# Patient Record
Sex: Female | Born: 1945 | Race: White | Hispanic: No | Marital: Single | Smoking: Never smoker
Health system: Southern US, Community
[De-identification: ages and names within clinical notes are randomized; demographics above are authoritative.]

## PROBLEM LIST (undated history)

## (undated) DIAGNOSIS — E7800 Pure hypercholesterolemia, unspecified: Secondary | ICD-10-CM

## (undated) DIAGNOSIS — E039 Hypothyroidism, unspecified: Secondary | ICD-10-CM

## (undated) DIAGNOSIS — C4490 Unspecified malignant neoplasm of skin, unspecified: Secondary | ICD-10-CM

## (undated) DIAGNOSIS — E785 Hyperlipidemia, unspecified: Secondary | ICD-10-CM

## (undated) DIAGNOSIS — I10 Essential (primary) hypertension: Secondary | ICD-10-CM

## (undated) DIAGNOSIS — N183 Chronic kidney disease, stage 3 (moderate): Secondary | ICD-10-CM

## (undated) HISTORY — PX: DILATION AND CURETTAGE OF UTERUS: SHX78

## (undated) HISTORY — DX: Hyperlipidemia, unspecified: E78.5

## (undated) HISTORY — DX: Hypothyroidism, unspecified: E03.9

## (undated) HISTORY — DX: Chronic kidney disease, stage 3 (moderate): N18.3

## (undated) HISTORY — PX: BREAST CYST EXCISION: SHX579

## (undated) HISTORY — DX: Pure hypercholesterolemia, unspecified: E78.00

## (undated) HISTORY — PX: TONSILLECTOMY: SUR1361

## (undated) HISTORY — DX: Essential (primary) hypertension: I10

## (undated) HISTORY — DX: Unspecified malignant neoplasm of skin, unspecified: C44.90

---

## 2013-07-13 DIAGNOSIS — I10 Essential (primary) hypertension: Secondary | ICD-10-CM

## 2013-07-13 DIAGNOSIS — E785 Hyperlipidemia, unspecified: Secondary | ICD-10-CM

## 2013-07-13 DIAGNOSIS — E7800 Pure hypercholesterolemia, unspecified: Secondary | ICD-10-CM

## 2013-07-13 HISTORY — DX: Essential (primary) hypertension: I10

## 2013-07-13 HISTORY — DX: Hyperlipidemia, unspecified: E78.5

## 2013-07-13 HISTORY — DX: Pure hypercholesterolemia, unspecified: E78.00

## 2013-12-08 DIAGNOSIS — E039 Hypothyroidism, unspecified: Secondary | ICD-10-CM

## 2013-12-08 HISTORY — DX: Hypothyroidism, unspecified: E03.9

## 2014-12-06 ENCOUNTER — Other Ambulatory Visit: Payer: Self-pay | Admitting: Internal Medicine

## 2014-12-06 DIAGNOSIS — N183 Chronic kidney disease, stage 3 unspecified: Secondary | ICD-10-CM

## 2014-12-06 DIAGNOSIS — Z0000 Encounter for general adult medical examination without abnormal findings: Secondary | ICD-10-CM | POA: Insufficient documentation

## 2014-12-06 HISTORY — DX: Chronic kidney disease, stage 3 unspecified: N18.30

## 2014-12-13 ENCOUNTER — Ambulatory Visit
Admission: RE | Admit: 2014-12-13 | Discharge: 2014-12-13 | Disposition: A | Discharge status: Home / Self Care | Payer: Medicare Other | Source: Ambulatory Visit | Attending: Internal Medicine | Admitting: Internal Medicine

## 2014-12-13 DIAGNOSIS — N183 Chronic kidney disease, stage 3 (moderate): Secondary | ICD-10-CM | POA: Diagnosis present

## 2014-12-27 ENCOUNTER — Other Ambulatory Visit: Payer: Self-pay | Admitting: Internal Medicine

## 2014-12-27 DIAGNOSIS — N281 Cyst of kidney, acquired: Secondary | ICD-10-CM

## 2015-06-13 ENCOUNTER — Ambulatory Visit: Payer: Medicare Other

## 2015-06-13 ENCOUNTER — Other Ambulatory Visit: Payer: Medicare Other

## 2015-06-22 ENCOUNTER — Ambulatory Visit
Admission: RE | Admit: 2015-06-22 | Discharge: 2015-06-22 | Disposition: A | Discharge status: Home / Self Care | Payer: Medicare Other | Source: Ambulatory Visit | Attending: Internal Medicine | Admitting: Internal Medicine

## 2015-06-22 DIAGNOSIS — N1339 Other hydronephrosis: Secondary | ICD-10-CM | POA: Diagnosis not present

## 2015-06-22 DIAGNOSIS — N281 Cyst of kidney, acquired: Secondary | ICD-10-CM | POA: Insufficient documentation

## 2015-06-22 DIAGNOSIS — R39198 Other difficulties with micturition: Secondary | ICD-10-CM | POA: Insufficient documentation

## 2015-07-24 ENCOUNTER — Encounter: Payer: Self-pay | Admitting: Urology

## 2015-07-24 ENCOUNTER — Ambulatory Visit (INDEPENDENT_AMBULATORY_CARE_PROVIDER_SITE_OTHER): Payer: Medicare Other | Admitting: Urology

## 2015-07-24 VITALS — BP 178/85 | HR 83 | Ht 63.0 in | Wt 158.0 lb

## 2015-07-24 DIAGNOSIS — N281 Cyst of kidney, acquired: Secondary | ICD-10-CM

## 2015-07-24 DIAGNOSIS — Q6100 Congenital renal cyst, unspecified: Secondary | ICD-10-CM

## 2015-07-24 DIAGNOSIS — N1330 Unspecified hydronephrosis: Secondary | ICD-10-CM

## 2015-07-24 LAB — URINALYSIS, COMPLETE
Bilirubin, UA: NEGATIVE
GLUCOSE, UA: NEGATIVE
KETONES UA: NEGATIVE
NITRITE UA: NEGATIVE
Protein, UA: NEGATIVE
RBC, UA: NEGATIVE
SPEC GRAV UA: 1.015 (ref 1.005–1.030)
UUROB: 0.2 mg/dL (ref 0.2–1.0)
pH, UA: 7 (ref 5.0–7.5)

## 2015-07-24 LAB — MICROSCOPIC EXAMINATION
BACTERIA UA: NONE SEEN
RBC, UA: NONE SEEN /hpf (ref 0–?)

## 2015-07-24 NOTE — Progress Notes (Signed)
07/24/2015 7:51 PM   Bing Quarry 30-Dec-1945 [id number]  Referring provider: Lavonia Dana, MD [address] [place], [place] [address]  Chief Complaint  Patient presents with  . Hydronephrosis    New Patient    HPI: 70 year old female who is referred for further evaluation of incidental mild bilateral hydronephrosis on most recent ultrasound. She has a history of stage III CKD and is followed by Dr. Juleen China.  She was initially evaluated by nephrology starting in 11/2014 for a rise in her creatinine to 1.3 up from her previous baseline of 1.0.  At the time, she was taking copious NSAIDs and has since stopped this medication. Her most recent creatinine was 1.1 on 04/14/2015. As part of her workup, she initially underwent a renal ultrasound on 12/13/2015 which showed an incidental mildly complex cyst in the left kidney measuring 2.5 cm with a septation.  As part of her follow-up for this), she underwent repeat renal ultrasound on 06/22/2015. The cyst remained stable but this time, she was noted to have very mild bilateral hydronephrosis, R>L which persisted after she emptied her bladder. Postvoid residual 29 cc.  She denies a history of voiding difficulties. She has no urinary complaints today. No history of urinary tract infections dysuria or sensation of incomplete bladder emptying. She denies a history of gross hematuria. No history of flank pain or kidney stones.    PMH: Past Medical History  Diagnosis Date  . Benign hypertension 07/13/2013    Last Assessment & Plan:  Is compliant with hypertensive medications without clear side effects or lack of control.  Nausea and itching are not symptomatic and nsaids are being avoided.     . Acquired hypothyroidism 12/08/2013    Last Assessment & Plan:  Tsh is followed and energy is doing well.    . Chronic kidney disease (CKD), stage III (moderate) 12/06/2014  . Pure hypercholesterolemia 07/13/2013    Last Assessment &  Plan:  Appropriate diet is being attempted and myalgia's are not noted.     Marland Kitchen HLD (hyperlipidemia) 07/13/2013  . Skin cancer     Surgical History: Past Surgical History  Procedure Laterality Date  . Dilation and curettage of uterus    . Tonsillectomy    . Breast cyst excision      Home Medications:    Medication List       This list is accurate as of: 07/24/15  7:51 PM.  Always use your most recent med list.               aspirin 81 MG chewable tablet  Chew by mouth.     CALCIUM 600 + MINERALS PO  Take by mouth.     Ginkgo Biloba 40 MG Tabs  Take by mouth.     L-Lysine 500 MG Tabs  Take by mouth.     levothyroxine 25 MCG tablet  Commonly known as:  SYNTHROID, LEVOTHROID  Take by mouth.     losartan-hydrochlorothiazide 50-12.5 MG tablet  Commonly known as:  HYZAAR  Take by mouth.     magnesium 30 MG tablet  Take 30 mg by mouth 2 (two) times daily.     MULTI-VITAMINS Tabs     niacin 500 MG tablet  Take by mouth.     pravastatin 10 MG tablet  Commonly known as:  PRAVACHOL  Take by mouth.     Ubiquinol 100 MG Caps  Take by mouth.     VITAMIN D-1000 MAX ST 1000  units tablet  Generic drug:  Cholecalciferol  Take by mouth.     vitamin E 400 UNIT capsule  Take by mouth.        Allergies: No Known Allergies  Family History: Family History  Problem Relation Age of Onset  . Bladder Cancer Neg Hx   . Prostate cancer Father   . Kidney cancer Neg Hx     Social History:  reports that she has never smoked. She does not have any smokeless tobacco history on file. She reports that she drinks alcohol. She reports that she does not use illicit drugs.  ROS: UROLOGY Frequent Urination?: No Hard to postpone urination?: No Burning/pain with urination?: No Get up at night to urinate?: No Leakage of urine?: No Urine stream starts and stops?: No Trouble starting stream?: No Do you have to strain to urinate?: No Blood in urine?: No Urinary tract  infection?: No Sexually transmitted disease?: No Injury to kidneys or bladder?: No Painful intercourse?: No Weak stream?: No Currently pregnant?: No Vaginal bleeding?: No Last menstrual period?: n  Gastrointestinal Nausea?: No Vomiting?: No Indigestion/heartburn?: No Diarrhea?: No Constipation?: No  Constitutional Fever: No Night sweats?: No Weight loss?: No Fatigue?: No  Skin Skin rash/lesions?: No Itching?: No  Eyes Blurred vision?: No Double vision?: No  Ears/Nose/Throat Sore throat?: No Sinus problems?: No  Hematologic/Lymphatic Swollen glands?: No Easy bruising?: No  Cardiovascular Leg swelling?: No Chest pain?: No  Respiratory Cough?: No Shortness of breath?: No  Endocrine Excessive thirst?: No  Musculoskeletal Back pain?: No Joint pain?: No  Neurological Headaches?: No Dizziness?: No  Psychologic Depression?: No Anxiety?: No  Physical Exam: BP 178/85 mmHg  Pulse 83  Ht 5' 3"  (1.6 m)  Wt 158 lb (71.668 kg)  BMI 28.00 kg/m2  Constitutional:  Alert and oriented, No acute distress. HEENT: [place] AT, moist mucus membranes.  Trachea midline, no masses. Cardiovascular: No clubbing, cyanosis, or edema. Respiratory: Normal respiratory effort, no increased work of breathing. GI: Abdomen is soft, nontender, nondistended, no abdominal masses GU: No CVA tenderness.  Skin: No rashes, bruises or suspicious lesions. Lymph: No cervical adenopathy. Neurologic: Grossly intact, no focal deficits, moving all 4 extremities. Psychiatric: Normal mood and affect.  Laboratory Data: Comprehensive Metabolic Panel (CMP) (40/34/7425 7:43 AM) Comprehensive Metabolic Panel (CMP) (95/63/8756 7:43 AM)  Component Value Ref Range  Glucose 102 70 - 110 mg/dL  Sodium 139 136 - 145 mmol/L  Potassium 5.1 3.6 - 5.1 mmol/L  Chloride 101 97 - 109 mmol/L  Carbon Dioxide (CO2) 28.8 22.0 - 32.0 mmol/L  Urea Nitrogen (BUN) 24 7 - 25 mg/dL  Creatinine 1.1 0.6 - 1.1 mg/dL    Glomerular Filtration Rate (eGFR), MDRD Estimate 49 (L) >60 mL/min/1.73sq m    Urinalysis Results for orders placed or performed in visit on 07/24/15  Microscopic Examination  Result Value Ref Range   WBC, UA 0-5 0 -  5 /hpf   RBC, UA None seen 0 -  2 /hpf   Epithelial Cells (non renal) 0-10 0 - 10 /hpf   Bacteria, UA None seen None seen/Few  Urinalysis, Complete  Result Value Ref Range   Specific Gravity, UA 1.015 1.005 - 1.030   pH, UA 7.0 5.0 - 7.5   Color, UA Yellow Yellow   Appearance Ur Clear Clear   Leukocytes, UA Trace (A) Negative   Protein, UA Negative Negative/Trace   Glucose, UA Negative Negative   Ketones, UA Negative Negative   RBC, UA Negative Negative   Bilirubin,  UA Negative Negative   Urobilinogen, Ur 0.2 0.2 - 1.0 mg/dL   Nitrite, UA Negative Negative   Microscopic Examination See below:     Pertinent Imaging:  Study Result     CLINICAL DATA: Follow-up left renal cyst.  EXAM: RENAL / URINARY TRACT ULTRASOUND COMPLETE  COMPARISON: Renal ultrasound of December 13, 2014.  FINDINGS: Right Kidney:  Length: 8.9 cm. The renal cortical echotexture remains slightly lower than that of the adjacent liver. There is mild hydronephrosis which persisted on the postvoid images.  Left Kidney:  Length: 10.4 cm. The cortical echogenicity is similar to that on the right. There is a dominant lobulated upper pole septated cystic mass containing some vascular flow measuring 3.1 x 3 x 3.5 cm. There is mild splitting of the central echo complex which persisted on postvoid imaging.  Bladder:  Urinary bladder is moderately distended. Slight lateral ureteral jets are observed. The prevoid volume is 373 cc with postvoid volume of 29 cc.  IMPRESSION: 1. Mild bilateral hydronephrosis greater on the right than on the left. This persists even on the postvoid images. This was not demonstrated on the previous study. 2. Stable appearing septated cystic  structure in the left upper pole demonstrating some mural vascularity. 3. Small postvoid residual urinary bladder volume of approximately 29 cc.   Electronically Signed  By: David Martinique M.D.  On: 06/22/2015 15:13   Renal ultrasound personally reviewed today. This was also compared to previous renal ultrasound.  Assessment & Plan:    1. Hydronephrosis, unspecified hydronephrosis type Asymptomatic incidental mild bilateral hydronephrosis with near normal renal function.    Scars that these findings are likely incidental may represent some low-grade reflux. I do not suspect that this is pathologic likely given that its bilateral nature.  I have recommended follow-up with a renal ultrasound in 6 months. She should return sooner if her renal function begins to decline or if she has any flank discomfort or any other urinary symptoms.  - Urinalysis, Complete - US Renal; Future  2. 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   Recommend f/u with RUS in 6 months.  If stable, then annually with increasing interval    Return in about 6 months (around 01/23/2016) for RUS prior, lab check.  Hollice Espy, MD  [facility] [address], [place], [place] [address] [phone]

## 2016-01-18 ENCOUNTER — Ambulatory Visit
Admission: RE | Admit: 2016-01-18 | Discharge: 2016-01-18 | Disposition: A | Discharge status: Home / Self Care | Payer: Medicare Other | Source: Ambulatory Visit | Attending: Urology | Admitting: Urology

## 2016-01-18 DIAGNOSIS — N281 Cyst of kidney, acquired: Secondary | ICD-10-CM | POA: Diagnosis not present

## 2016-01-18 DIAGNOSIS — N1330 Unspecified hydronephrosis: Secondary | ICD-10-CM

## 2016-01-24 ENCOUNTER — Ambulatory Visit: Payer: Medicare Other | Admitting: Urology

## 2016-01-25 ENCOUNTER — Ambulatory Visit: Payer: Medicare Other | Admitting: Urology

## 2016-01-25 ENCOUNTER — Encounter: Payer: Self-pay | Admitting: Urology

## 2016-01-25 VITALS — BP 140/77 | HR 66 | Ht 63.0 in | Wt 158.6 lb

## 2016-01-25 DIAGNOSIS — N1330 Unspecified hydronephrosis: Secondary | ICD-10-CM | POA: Diagnosis not present

## 2016-01-25 DIAGNOSIS — N281 Cyst of kidney, acquired: Secondary | ICD-10-CM | POA: Diagnosis not present

## 2016-01-25 NOTE — Progress Notes (Signed)
01/25/2016 10:40 AM   Erika Schwartz 17-Mar-1945 [id number]  Referring provider: Kirk Ruths, MD [place] [facility] [place], [place] [address]  Chief Complaint  Patient presents with  . Follow-up    RUS results    HPI: 70 year old female with incidental mild bilateral hydronephrosis on most recent ultrasound. She has a history of stage III CKD and is followed by Dr. Juleen China.  She was initially evaluated by nephrology starting in 11/2014 for a rise in her creatinine to 1.3 up from her previous baseline of 1.0.  At the time, she was taking copious NSAIDs and has since stopped this medication.   As part of her workup, she initially underwent a renal ultrasound on 12/13/2014 which showed an incidental mildly complex cyst in the left kidney measuring 2.5 cm with a septation.  As part of her follow-up for this, she underwent repeat renal ultrasound on 06/22/2015. The cyst remained stable but this time, she was noted to have very mild bilateral hydronephrosis, R>L which persisted after she emptied her bladder. Postvoid residual 29 cc.  She returns today with follow-up renal ultrasound for surveillance. Shows enlarging left Bosniak 2 cyst with thin septation, 4.2 cm, chronic mild dilation of the right renal pelvis which is stable.    She denies a history of voiding difficulties. She has no urinary complaints today. No history of urinary tract infections dysuria or sensation of incomplete bladder emptying. She denies a history of gross hematuria. No history of flank pain or kidney stones.  Cr stable, 1.2 on 11/28/15.   PMH: Past Medical History:  Diagnosis Date  . Acquired hypothyroidism 12/08/2013   Last Assessment & Plan:  Tsh is followed and energy is doing well.    . Benign hypertension 07/13/2013   Last Assessment & Plan:  Is compliant with hypertensive medications without clear side effects or lack of control.  Nausea and itching are not symptomatic and  nsaids are being avoided.     . Chronic kidney disease (CKD), stage III (moderate) 12/06/2014  . HLD (hyperlipidemia) 07/13/2013  . Pure hypercholesterolemia 07/13/2013   Last Assessment & Plan:  Appropriate diet is being attempted and myalgia's are not noted.     . Skin cancer     Surgical History: Past Surgical History:  Procedure Laterality Date  . BREAST CYST EXCISION    . DILATION AND CURETTAGE OF UTERUS    . TONSILLECTOMY      Home Medications:    Medication List       Accurate as of 01/25/16 10:40 AM. Always use your most recent med list.          aspirin 81 MG chewable tablet Chew by mouth.   CALCIUM 600 + MINERALS PO Take by mouth.   Ginkgo Biloba 40 MG Tabs Take by mouth.   L-Lysine 500 MG Tabs Take by mouth.   levothyroxine 25 MCG tablet Commonly known as:  SYNTHROID, LEVOTHROID Take by mouth.   losartan-hydrochlorothiazide 50-12.5 MG tablet Commonly known as:  HYZAAR Take by mouth.   magnesium 30 MG tablet Take 30 mg by mouth 2 (two) times daily.   MULTI-VITAMINS Tabs   niacin 500 MG tablet Take by mouth.   pravastatin 10 MG tablet Commonly known as:  PRAVACHOL Take by mouth.   Ubiquinol 100 MG Caps Take by mouth.   VITAMIN D-1000 MAX ST 1000 units tablet Generic drug:  Cholecalciferol Take by mouth.   vitamin E 400 UNIT capsule Take  by mouth.       Allergies: No Known Allergies  Family History: Family History  Problem Relation Age of Onset  . Bladder Cancer Neg Hx   . Prostate cancer Father   . Kidney cancer Neg Hx     Social History:  reports that she has never smoked. She has never used smokeless tobacco. She reports that she drinks alcohol. She reports that she does not use drugs.  ROS: UROLOGY Frequent Urination?: No Hard to postpone urination?: No Burning/pain with urination?: No Get up at night to urinate?: No Leakage of urine?: No Urine stream starts and stops?: No Trouble starting stream?: No Do you have to  strain to urinate?: No Blood in urine?: No Urinary tract infection?: No Sexually transmitted disease?: No Injury to kidneys or bladder?: No Painful intercourse?: No Weak stream?: No Currently pregnant?: No Vaginal bleeding?: No Last menstrual period?: n  Gastrointestinal Nausea?: No Vomiting?: No Indigestion/heartburn?: No Diarrhea?: No Constipation?: No  Constitutional Fever: No Night sweats?: No Weight loss?: No Fatigue?: No  Skin Skin rash/lesions?: No Itching?: No  Eyes Blurred vision?: No Double vision?: No  Ears/Nose/Throat Sore throat?: No Sinus problems?: No  Hematologic/Lymphatic Swollen glands?: No Easy bruising?: No  Cardiovascular Leg swelling?: No Chest pain?: No  Respiratory Cough?: No Shortness of breath?: No  Endocrine Excessive thirst?: No  Musculoskeletal Back pain?: No Joint pain?: Yes  Neurological Headaches?: No Dizziness?: No  Psychologic Depression?: No Anxiety?: No  Physical Exam: BP 140/77 (BP Location: Left Arm, Patient Position: Sitting, Cuff Size: Large)   Pulse 66   Ht 5\' 3"  (1.6 m)   Wt 158 lb 9.6 oz (71.9 kg)   BMI 28.09 kg/m   Constitutional:  Alert and oriented, No acute distress. HEENT: [place] AT, moist mucus membranes.  Trachea midline, no masses. Cardiovascular: No clubbing, cyanosis, or edema. Respiratory: Normal respiratory effort, no increased work of breathing. GI: Abdomen is soft, nontender, nondistended, no abdominal masses GU: No CVA tenderness.  Skin: No rashes, bruises or suspicious lesions. Neurologic: Grossly intact, no focal deficits, moving all 4 extremities. Psychiatric: Normal mood and affect.  Laboratory Data: Cr 11/28/15, 1.2   Pertinent Imaging: CLINICAL DATA:  Follow-up left renal cyst.  EXAM: RENAL / URINARY TRACT ULTRASOUND COMPLETE  COMPARISON:  Renal ultrasound of December 13, 2014.  FINDINGS: Right Kidney:  Length: 8.9 cm. The renal cortical echotexture remains  slightly lower than that of the adjacent liver. There is mild hydronephrosis which persisted on the postvoid images.  Left Kidney:  Length: 10.4 cm. The cortical echogenicity is similar to that on the right. There is a dominant lobulated upper pole septated cystic mass containing some vascular flow measuring 3.1 x 3 x 3.5 cm. There is mild splitting of the central echo complex which persisted on postvoid imaging.  Bladder:  Urinary bladder is moderately distended. Slight lateral ureteral jets are observed. The prevoid volume is 373 cc with postvoid volume of 29 cc.  IMPRESSION: 1. Mild bilateral hydronephrosis greater on the right than on the left. This persists even on the postvoid images. This was not demonstrated on the previous study. 2. Stable appearing septated cystic structure in the left upper pole demonstrating some mural vascularity. 3. Small postvoid residual urinary bladder volume of approximately 29 cc.   Electronically Signed   By: David  Martinique M.D.   On: 06/22/2015 15:13  Renal ultrasound personally reviewed today. This was also compared to previous renal ultrasound.  Assessment & Plan:    1. Hydronephrosis,  unspecified hydronephrosis type Asymptomatic incidental mild bilateral hydronephrosis, R>L with near normal renal function, chronic and unlikely pathologic  I have recommended follow-up with a renal ultrasound in 12 months. She should return sooner if her renal function begins to decline or if she has any flank discomfort or any other urinary symptoms.  - Urinalysis, Complete - US Renal; Future  2. Renal cyst, left Left 3.5 --> 4.2 cm Bosniak II cyst  Recommend f/u with RUS in 12 months  Return in about 1 year (around 01/24/2017) for RUS.  Hollice Espy, MD  [facility] [address] [place], [place] [address] [phone]

## 2016-01-26 LAB — BASIC METABOLIC PANEL
BUN / CREAT RATIO: 23 (ref 12–28)
BUN: 25 mg/dL (ref 8–27)
CHLORIDE: 97 mmol/L (ref 96–106)
CO2: 28 mmol/L (ref 18–29)
Calcium: 10 mg/dL (ref 8.7–10.3)
Creatinine, Ser: 1.1 mg/dL — ABNORMAL HIGH (ref 0.57–1.00)
GFR calc non Af Amer: 51 mL/min/{1.73_m2} — ABNORMAL LOW (ref >59)
GFR, EST AFRICAN AMERICAN: 58 mL/min/{1.73_m2} — AB (ref >59)
Glucose: 101 mg/dL — ABNORMAL HIGH (ref 65–99)
Potassium: 4.9 mmol/L (ref 3.5–5.2)
Sodium: 140 mmol/L (ref 134–144)

## 2017-01-17 ENCOUNTER — Ambulatory Visit
Admission: RE | Admit: 2017-01-17 | Discharge: 2017-01-17 | Disposition: A | Discharge status: Home / Self Care | Payer: Medicare Other | Source: Ambulatory Visit | Attending: Urology | Admitting: Urology

## 2017-01-17 DIAGNOSIS — N2889 Other specified disorders of kidney and ureter: Secondary | ICD-10-CM | POA: Insufficient documentation

## 2017-01-17 DIAGNOSIS — N281 Cyst of kidney, acquired: Secondary | ICD-10-CM | POA: Diagnosis not present

## 2017-01-17 DIAGNOSIS — N1330 Unspecified hydronephrosis: Secondary | ICD-10-CM

## 2017-01-22 ENCOUNTER — Encounter: Payer: Self-pay | Admitting: Urology

## 2017-01-22 ENCOUNTER — Ambulatory Visit: Payer: Medicare Other | Admitting: Urology

## 2017-01-22 VITALS — BP 119/70 | HR 61 | Ht 61.0 in | Wt 159.0 lb

## 2017-01-22 DIAGNOSIS — N1330 Unspecified hydronephrosis: Secondary | ICD-10-CM

## 2017-01-22 DIAGNOSIS — N281 Cyst of kidney, acquired: Secondary | ICD-10-CM | POA: Diagnosis not present

## 2017-01-22 NOTE — Progress Notes (Signed)
01/22/2017 10:52 AM   Erika Schwartz [birth year] [id number]  Referring provider: Kirk Ruths, MD [facility] [place], E[place] [address]  Chief Complaint  Patient presents with  . Hydronephrosis    1year    HPI: 71 year old female with incidental mild bilateral hydronephrosis and left Bosniak II renal cyst identified on renal ultrasound.  She returns today for serial imaging.  She has had no changes in her past medical history and is doing well this year.  She has a history of stage III CKD and is followed by Dr. Juleen China.  She was initially evaluated by n PSA results ephrology starting in 11/2014 for a rise in her creatinine to 1.3 up from her previous baseline of 1.0.  At the time, she was taking copious NSAIDs and has since stopped this medication.  Her most recent creatinine was stable at 1.1 as of 11/2016.  As part of her workup, she initially underwent a renal ultrasound on 12/13/2014 which showed an incidental mildly complex cyst in the left kidney measuring 2.5 cm with a septation.  As part of her follow-up for this, she underwent repeat renal ultrasound on 06/22/2015. The cyst remained stable but this time, she was noted to have very mild bilateral hydronephrosis, R>L which persisted after she emptied her bladder. Postvoid residual 29 cc.  She returns today with follow-up renal ultrasound for surveillance. Shows enlarging left Bosniak 2 cyst with thin septation, 4.2 cm, chronic mild dilation of the right renal pelvis which is stable.    She continues to be asymptomatic.  She has no voiding complaints today.  No flank pain.   PMH: Past Medical History:  Diagnosis Date  . Acquired hypothyroidism 12/08/2013   Last Assessment & Plan:  Tsh is followed and energy is doing well.    . Benign hypertension 07/13/2013   Last Assessment & Plan:  Is compliant with hypertensive medications without clear side effects or lack of control.  Nausea and  itching are not symptomatic and nsaids are being avoided.     . Chronic kidney disease (CKD), stage III (moderate) ([place]) 12/06/2014  . HLD (hyperlipidemia) 07/13/2013  . Pure hypercholesterolemia 07/13/2013   Last Assessment & Plan:  Appropriate diet is being attempted and myalgia's are not noted.     . Skin cancer     Surgical History: Past Surgical History:  Procedure Laterality Date  . BREAST CYST EXCISION    . DILATION AND CURETTAGE OF UTERUS    . TONSILLECTOMY      Home Medications:  Allergies as of 01/22/2017   No Known Allergies     Medication List        Accurate as of 01/22/17 10:52 AM. Always use your most recent med list.          aspirin 81 MG chewable tablet Chew by mouth.   CALCIUM 600 + MINERALS PO Take by mouth.   Ginkgo Biloba 40 MG Tabs Take by mouth.   L-Lysine 500 MG Tabs Take by mouth.   latanoprost 0.005 % ophthalmic solution Commonly known as:  XALATAN   levothyroxine 25 MCG tablet Commonly known as:  SYNTHROID, LEVOTHROID Take by mouth.   losartan-hydrochlorothiazide 50-12.5 MG tablet Commonly known as:  HYZAAR Take by mouth.   magnesium 30 MG tablet Take 30 mg by mouth 2 (two) times daily.   MAGNESIUM PO Take by mouth.   MULTI-VITAMINS Tabs   niacin 500 MG tablet Take by mouth.  pravastatin 10 MG tablet Commonly known as:  PRAVACHOL Take by mouth.   RUTIN PO Take 50 mg by mouth.   timolol 0.5 % ophthalmic solution Commonly known as:  TIMOPTIC   VITAMIN D-1000 MAX ST 1000 units tablet Generic drug:  Cholecalciferol Take by mouth.   vitamin E 400 UNIT capsule Take by mouth.       Allergies: No Known Allergies  Family History: Family History  Problem Relation Age of Onset  . Bladder Cancer Neg Hx   . Prostate cancer Father   . Kidney cancer Neg Hx     Social History:  reports that  has never smoked. she has never used smokeless tobacco. She reports that she drinks alcohol. She reports that she does not use  drugs.  ROS: UROLOGY Frequent Urination?: No Hard to postpone urination?: No Burning/pain with urination?: No Get up at night to urinate?: No Leakage of urine?: No Urine stream starts and stops?: No Trouble starting stream?: No Do you have to strain to urinate?: No Blood in urine?: No Urinary tract infection?: No Sexually transmitted disease?: No Injury to kidneys or bladder?: No Painful intercourse?: No Weak stream?: No Currently pregnant?: No Vaginal bleeding?: No Last menstrual period?: n  Gastrointestinal Nausea?: No Vomiting?: No Indigestion/heartburn?: No Diarrhea?: No Constipation?: No  Constitutional Fever: No Night sweats?: No Weight loss?: No Fatigue?: No  Skin Skin rash/lesions?: No Itching?: No  Eyes Blurred vision?: No Double vision?: No  Ears/Nose/Throat Sore throat?: No Sinus problems?: No  Hematologic/Lymphatic Swollen glands?: No Easy bruising?: No  Cardiovascular Leg swelling?: No Chest pain?: No  Respiratory Cough?: No Shortness of breath?: No  Endocrine Excessive thirst?: No  Musculoskeletal Back pain?: No Joint pain?: No  Neurological Headaches?: No Dizziness?: No  Psychologic Depression?: No Anxiety?: No  Physical Exam: BP 119/70   Pulse 61   Ht 5\' 1"  (1.549 m)   Wt 159 lb (72.1 kg)   BMI 30.04 kg/m   Constitutional:  Alert and oriented, No acute distress. HEENT: [place] AT, moist mucus membranes.  Trachea midline, no masses. Cardiovascular: No clubbing, cyanosis, or edema. Respiratory: Normal respiratory effort, no increased work of breathing. GI: Abdomen is soft, nontender, nondistended, no abdominal masses GU: No CVA tenderness.  Skin: No rashes, bruises or suspicious lesions. Neurologic: Grossly intact, no focal deficits, moving all 4 extremities. Psychiatric: Normal mood and affect.  Laboratory Data: Cr 11/28/15, 1.2   Pertinent Imaging: CLINICAL DATA:  Chronic hydronephrosis.  Renal  cyst.  EXAM: RENAL / URINARY TRACT ULTRASOUND COMPLETE  COMPARISON:  01/18/2016  FINDINGS: Right Kidney:  Length: 9 cm. Pelviectasis without hydronephrosis. Negative for mass.  Left Kidney:  Length: 9.5 cm. Cyst in the upper pole measuring up to 4.2 cm with 2 thin septations and posterior lobulation. No detected change in size or complexity. No hydronephrosis.  Bladder:  Appears normal for degree of bladder distention. Bilateral ureteral jets. Pre and postvoid volume is 164 and 11 cc.  IMPRESSION: 1. Mild right pelviectasis is stable.  No hydronephrosis. 2. 4.2 cm lobulated and thinly septated left renal cyst. No enlargement or progressive complexity compared to 12/22/2015.   Electronically Signed   By: Monte Fantasia M.D.   On: 01/17/2017 15:34  Renal ultrasound personally reviewed today. This was also compared to previous renal ultrasound from 2017.  Assessment & Plan:    1. Hydronephrosis, unspecified hydronephrosis type Asymptomatic incidental mild bilateral hydronephrosis, R>L with near normal renal function, chronic and unlikely pathologic.    This appears  to be stable over time.  Etiology unclear.  Plan for repeat surveillance renal ultrasound in 12 months, monitor creatinine.  If this is unchanged next year, will discontinue surveillance.  - Urinalysis, Complete - US Renal; Future  2. Renal cyst, left Left 4.2 cm Bosniak II cyst, appears stable from last year which is reassuring. No further surveillance is needed for this lesion, however, will be following renal ultrasound as above  Return in about 1 year (around 01/22/2018) for RUS.  Hollice Espy, MD  [facility] [address], [place], [place] [address] [phone]

## 2017-07-21 IMAGING — US US RENAL
1 series · 14 of 25 positions shown · non-contrast
Comparison: 06/22/2015 and 12/13/2014.

CLINICAL DATA: Followup left renal cyst and for hydronephrosis.

EXAM:
RENAL / URINARY TRACT ULTRASOUND COMPLETE

[Series 1: us renal · 0.19mm/px · 14 of 54 slices shown]
[im 1/54]
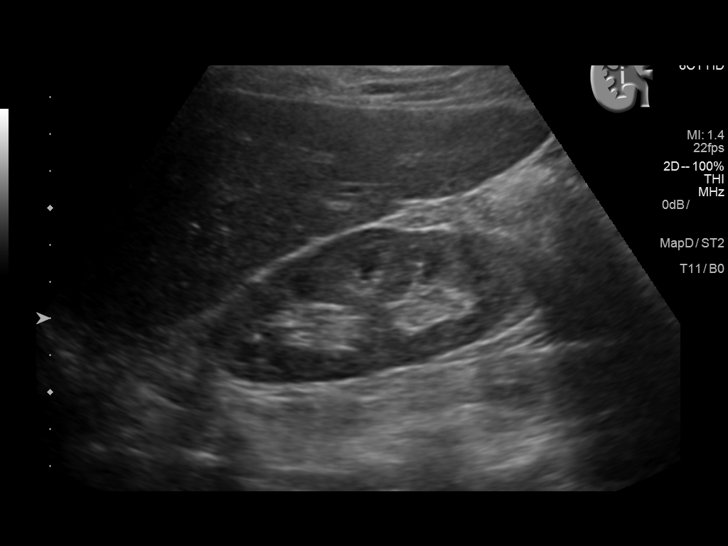
[im 5/54]
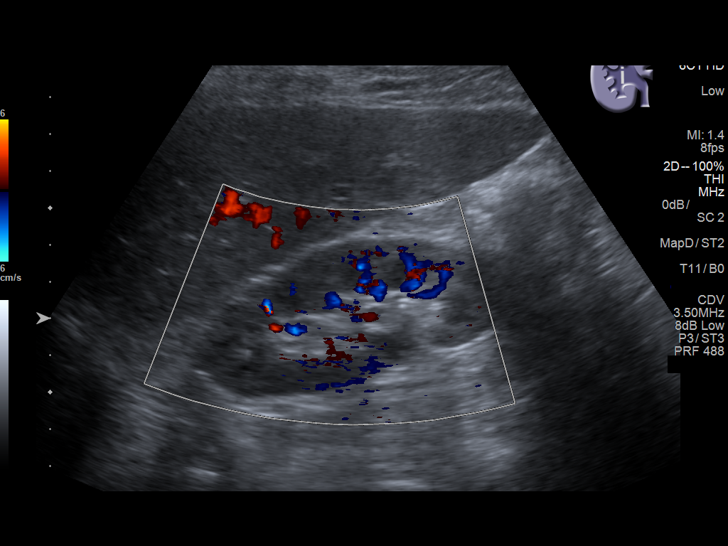
[im 9/54]
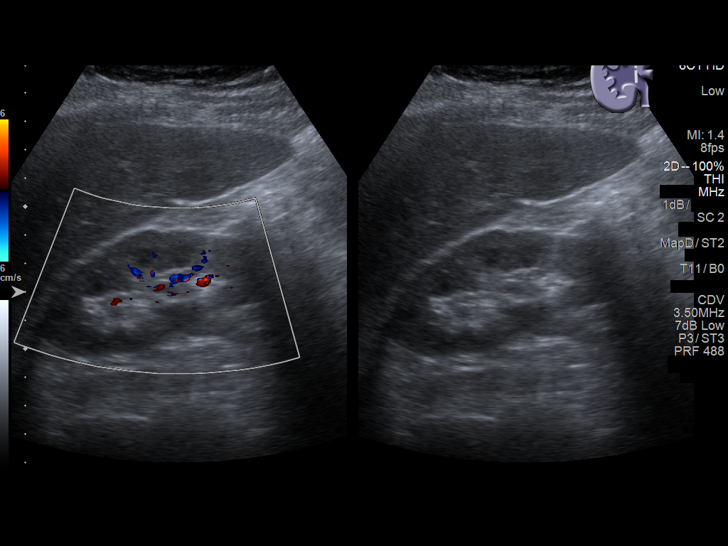
[im 14/54]
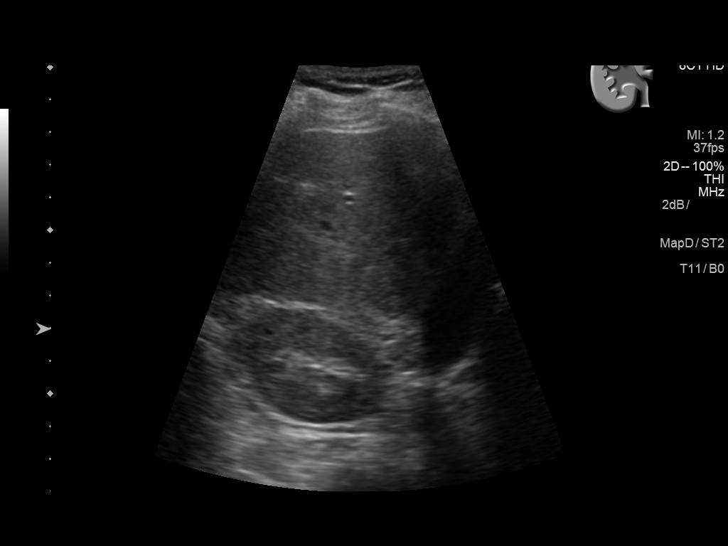
[im 18/54]
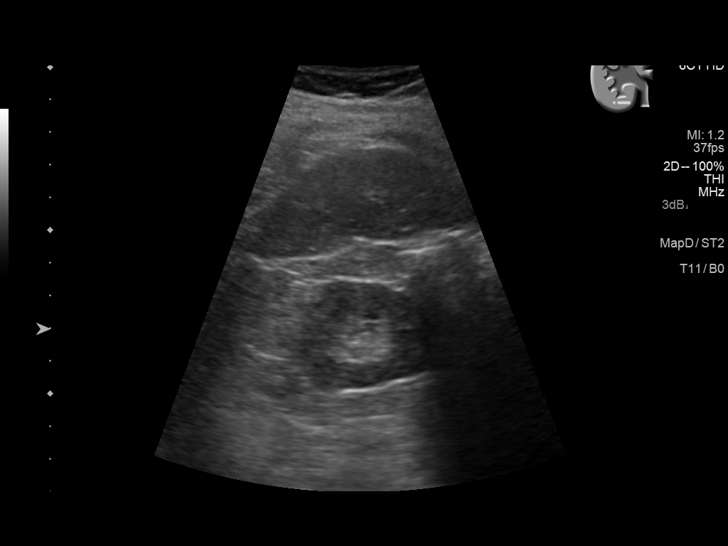
[im 20/54]
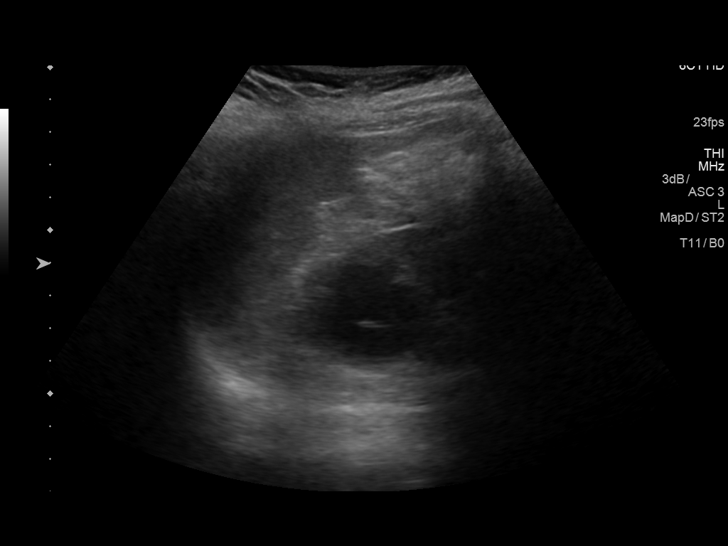
[im 25/54]
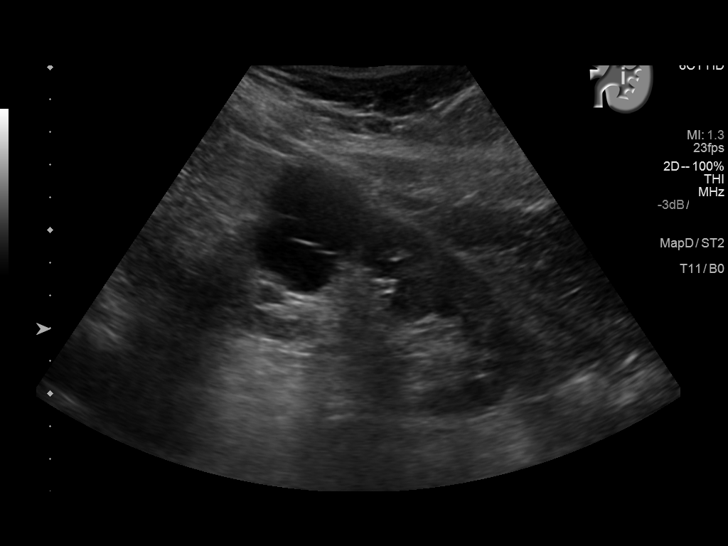
[im 29/54]
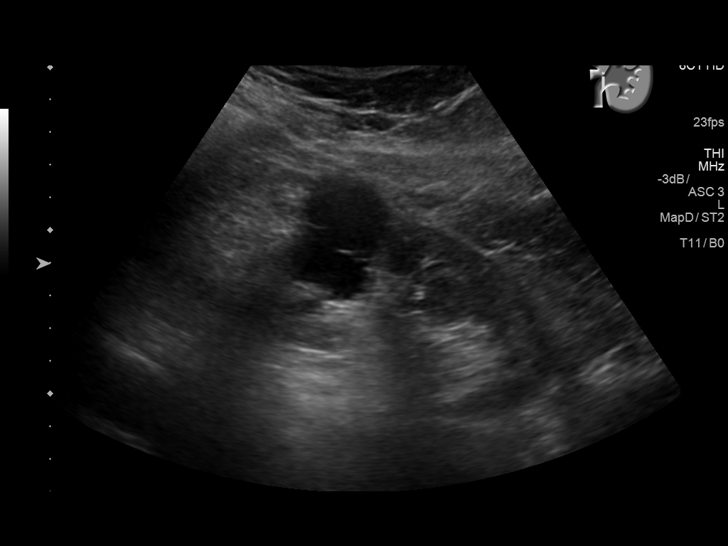
[im 34/54]
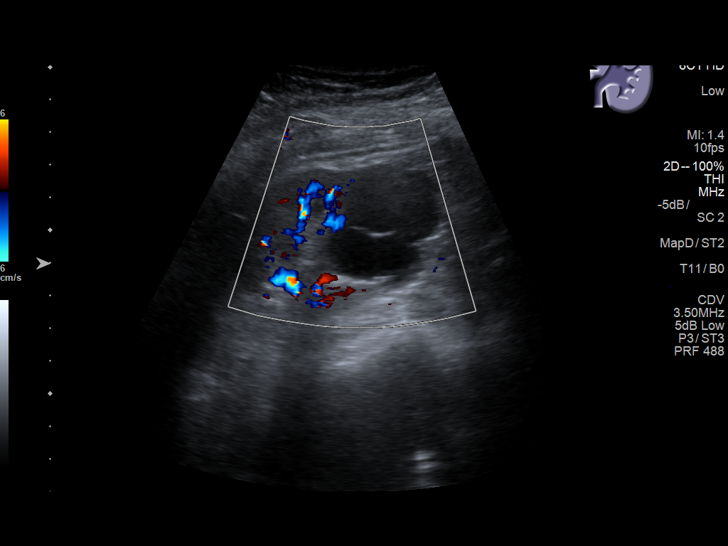
[im 36/54]
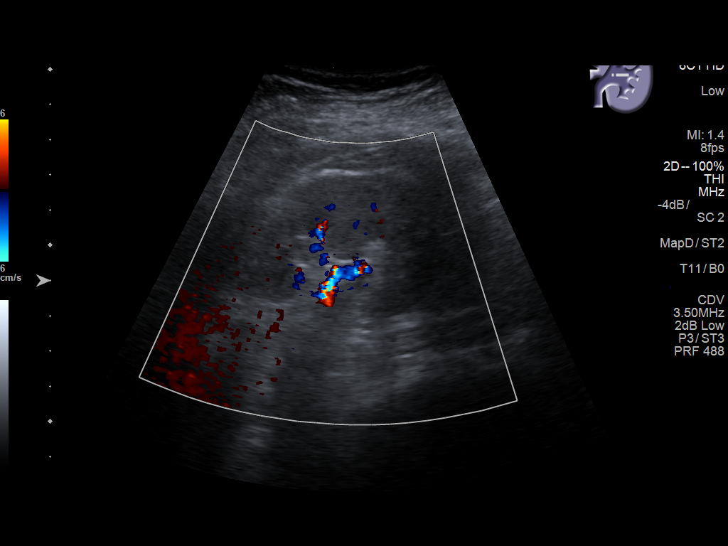
[im 40/54]
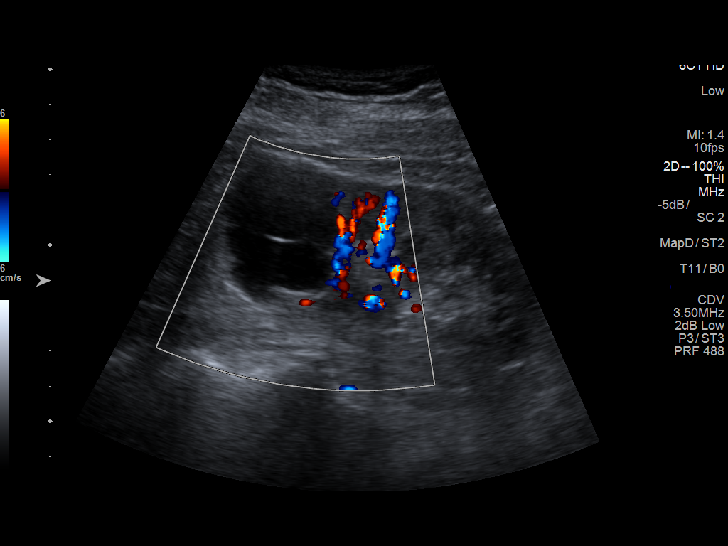
[im 45/54]
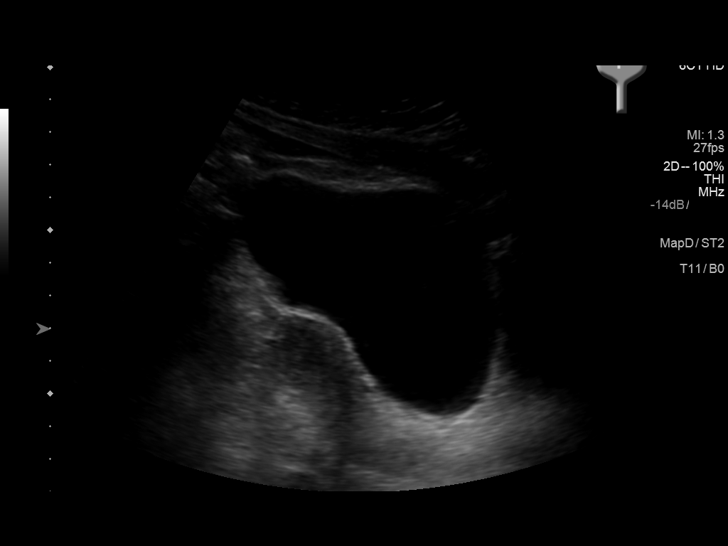
[im 49/54]
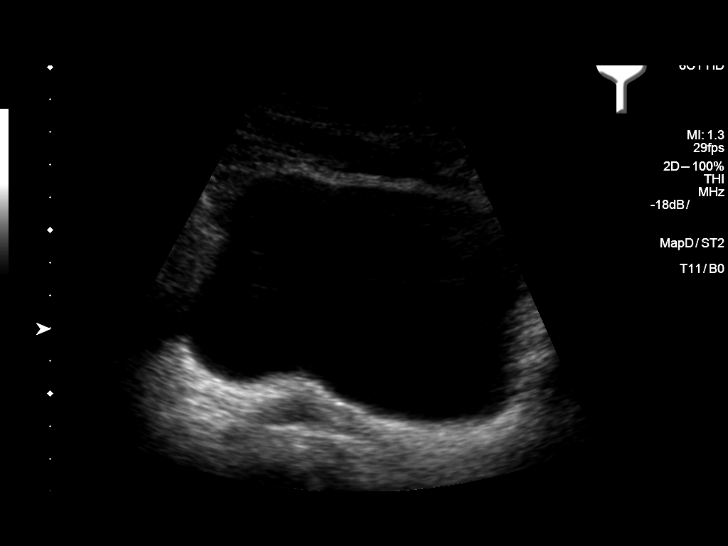
[im 54/54]
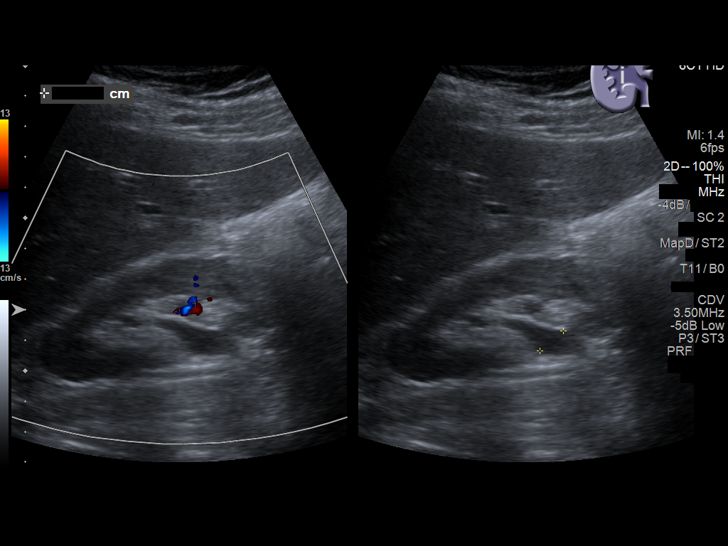

[14 of 25 positions shown; findings below may reference images not displayed]

FINDINGS: Right Kidney:

Length: 5.2 cm. Mild dilation of the right renal pelvis stable the
prior study. This persists on postvoid imaging. No right renal mass
or stone. Normal parenchymal echogenicity.

Left Kidney:

Length: 9.9 cm. Normal parenchymal echogenicity. 3.7 x 3.3 x 4.3 cm
cyst arises from the upper pole with a thin septation. No other
complicating feature. No other left renal masses, no stones and no
hydronephrosis.

Bladder:

Appears normal for degree of bladder distention.
IMPRESSION: 1. Mild dilation of the right renal pelvis stable from the prior
study. This is likely chronic.
2. No left hydronephrosis.
3. Left renal upper pole cyst with a thin septation, sonographically
consistent with a Bosniak II cyst.

## 2018-01-07 ENCOUNTER — Ambulatory Visit: Payer: Medicare Other

## 2018-01-07 ENCOUNTER — Ambulatory Visit
Admission: RE | Admit: 2018-01-07 | Discharge: 2018-01-07 | Disposition: A | Discharge status: Home / Self Care | Payer: Medicare Other | Source: Ambulatory Visit | Attending: Urology | Admitting: Urology

## 2018-01-07 DIAGNOSIS — N2889 Other specified disorders of kidney and ureter: Secondary | ICD-10-CM | POA: Diagnosis not present

## 2018-01-07 DIAGNOSIS — N281 Cyst of kidney, acquired: Secondary | ICD-10-CM | POA: Diagnosis not present

## 2018-01-07 DIAGNOSIS — N1330 Unspecified hydronephrosis: Secondary | ICD-10-CM

## 2018-01-22 ENCOUNTER — Ambulatory Visit: Payer: Medicare Other | Admitting: Urology

## 2018-03-03 NOTE — Progress Notes (Signed)
03/04/2018  10:16 AM   Bing Quarry 07-Feb-1946 [id number]  Referring provider: Kirk Ruths, MD [facility] [place], [place] [address]  Chief Complaint  Patient presents with  . Hydronephrosis    1year    HPI: Erika Schwartz is a 73 y.o. 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  It was noted during a renal ultrasound on 06/22/2014 that she had asymptomatic incidental mild bilaternal hydronephrosis, R>L with near normal renal function, chronic and unlikely pathologic.  Its etiology is unclear, but it has appeared to be stable over time.  She has a history of stage III CKD, which is followed by Dr. Juleen China.  Her creatinine remains stable at 1.0 as of 10/19.  She follows up on renal ultrasound today.  She is overall doing well.  She denies any flank pain or urinary symptoms.  PMH: Past Medical History:  Diagnosis Date  . Acquired hypothyroidism 12/08/2013   Last Assessment & Plan:  Tsh is followed and energy is doing well.    . Benign hypertension 07/13/2013   Last Assessment & Plan:  Is compliant with hypertensive medications without clear side effects or lack of control.  Nausea and itching are not symptomatic and nsaids are being avoided.     . Chronic kidney disease (CKD), stage III (moderate) ([place]) 12/06/2014  . HLD (hyperlipidemia) 07/13/2013  . Pure hypercholesterolemia 07/13/2013   Last Assessment & Plan:  Appropriate diet is being attempted and myalgia's are not noted.     . Skin cancer     Surgical History: Past Surgical History:  Procedure Laterality Date  . BREAST CYST EXCISION    . DILATION AND CURETTAGE OF UTERUS    . TONSILLECTOMY      Home Medications:  Allergies as of 03/04/2018   No Known Allergies     Medication List       Accurate as of March 04, 2018 10:16 AM. Always use your most recent med list.        aspirin 81  MG chewable tablet Chew by mouth.   CALCIUM 600 + MINERALS PO Take by mouth.   Ginkgo Biloba 40 MG Tabs Take by mouth.   L-Lysine 500 MG Tabs Take by mouth.   latanoprost 0.005 % ophthalmic solution Commonly known as:  XALATAN   levothyroxine 25 MCG tablet Commonly known as:  SYNTHROID, LEVOTHROID Take by mouth.   losartan-hydrochlorothiazide 50-12.5 MG tablet Commonly known as:  HYZAAR Take by mouth.   magnesium 30 MG tablet Take 30 mg by mouth 2 (two) times daily.   MULTI-VITAMINS Tabs   niacin 500 MG tablet Take by mouth.   pravastatin 10 MG tablet Commonly known as:  PRAVACHOL Take by mouth.   timolol 0.5 % ophthalmic solution Commonly known as:  TIMOPTIC   VITAMIN D-1000 MAX ST 25 MCG (1000 UT) tablet Generic drug:  Cholecalciferol Take by mouth.   vitamin E 400 UNIT capsule Take by mouth.       Allergies: No Known Allergies  Family History: Family History  Problem Relation Age of Onset  . Bladder Cancer Neg Hx   . Prostate cancer Father   . Kidney cancer Neg Hx     Social History:  reports that she has never smoked. She has never used smokeless tobacco. She reports current alcohol use. She reports that she does not use drugs.  ROS: UROLOGY Frequent Urination?: No  Hard to postpone urination?: No Burning/pain with urination?: No Get up at night to urinate?: No Leakage of urine?: No Urine stream starts and stops?: No Trouble starting stream?: No Do you have to strain to urinate?: No Blood in urine?: No Urinary tract infection?: No Sexually transmitted disease?: No Injury to kidneys or bladder?: No Painful intercourse?: No Weak stream?: No Currently pregnant?: No Vaginal bleeding?: No Last menstrual period?: n  Gastrointestinal Nausea?: No Vomiting?: No Indigestion/heartburn?: No Diarrhea?: No Constipation?: No  Constitutional Fever: No Night sweats?: No Weight loss?: No Fatigue?: No  Skin Skin rash/lesions?:  No Itching?: No  Eyes Blurred vision?: No Double vision?: No  Ears/Nose/Throat Sore throat?: No Sinus problems?: No  Hematologic/Lymphatic Swollen glands?: No Easy bruising?: No  Cardiovascular Leg swelling?: No Chest pain?: No  Respiratory Cough?: No Shortness of breath?: No  Endocrine Excessive thirst?: No  Musculoskeletal Back pain?: No Joint pain?: No  Neurological Headaches?: No Dizziness?: No  Psychologic Depression?: No Anxiety?: No  Physical Exam: BP 108/70   Pulse 66   Ht 5\' 2"  (1.575 m)   Wt 156 lb (70.8 kg)   BMI 28.53 kg/m   Constitutional: Well nourished. Alert and oriented, No acute distress. Cardiovascular: No clubbing, cyanosis, or edema. Respiratory: Normal respiratory effort, no increased work of breathing. GI: Abdomen is soft, non tender, non distended, no abdominal masses. No hernias appreciated. GU: No CVA tenderness. Skin: No rashes, bruises or suspicious lesions. Neurologic: Grossly intact, no focal deficits, moving all 4 extremities. Psychiatric: Normal mood and affect.   Laboratory Data: Creatinine 1.0 on 12/01/2017  Pertinent Imaging: Results for orders placed during the hospital encounter of 01/07/18  US RENAL   Narrative CLINICAL DATA:  Hydronephrosis, renal cyst follow-up, stage III chronic kidney disease, benign hypertension  EXAM: RENAL / URINARY TRACT ULTRASOUND COMPLETE  COMPARISON:  01/17/2017  FINDINGS: Right Kidney:  Renal measurements: 9.4 x 3.5 x 4.2 cm = volume: 72 mL. Normal cortical thickness and echogenicity. Mild pelviectasis unchanged. No significant calyceal dilatation, renal mass or shadowing calcification.  Left Kidney:  Renal measurements: 9.4 x 3.7 x 4.0 cm = volume: 74 mL. Normal cortical thickness and echogenicity. Mildly complicated cyst at upper pole of LEFT kidney containing a single thin septation, lesion overall measuring 4.2 x 3.2 x 4.0 cm previously 4.2 x 3.9 x 4.1 cm. No mural  nodularity within the observed lesion. No additional mass, hydronephrosis or shadowing calcification.  Bladder:  Appears normal for degree of bladder distention. Ureteral jets noted.  IMPRESSION: No significant interval change in appearance of a mildly complicated cystic lesion at the upper pole LEFT kidney 4.2 x 3.2 x 4.0 cm, containing a single thin septation.  Persistent mild RIGHT pelviectasis without hydronephrosis.  No new renal cell RIGHT foot abnormalities.   Electronically Signed   By: Lavonia Dana M.D.   On: 01/07/2018 14:45    I personally reviewed the imaging and I agreed with radiologic interpretation.  Assessment & Plan:    1. Hydronephrosis, unspecified hydronephrosis type - Has remained stable x multiple years, minimal right pyelectasias of unknown etiology  -given stability and asymptomatic nature, will continue to follow  -Cr improved  - Follow-up in 2 years for renal US (increase interval)  2. Renal cyst, left - Cyst has remained stable at 4.2 cm -Bosniak 2 - Renal US as above  Return in about 2 years (around 03/04/2020) for Renal US.  [facility] [address] [place], [place]  [address] [phone]  I, Adele Schilder, am acting as a scribe for Hollice Espy, MD.    I have reviewed the above documentation for accuracy and completeness, and I agree with the above.   Hollice Espy, MD

## 2018-03-04 ENCOUNTER — Ambulatory Visit: Payer: Medicare Other | Admitting: Urology

## 2018-03-04 ENCOUNTER — Encounter: Payer: Self-pay | Admitting: Urology

## 2018-03-04 VITALS — BP 108/70 | HR 66 | Ht 62.0 in | Wt 156.0 lb

## 2018-03-04 DIAGNOSIS — N1330 Unspecified hydronephrosis: Secondary | ICD-10-CM

## 2018-03-04 DIAGNOSIS — N281 Cyst of kidney, acquired: Secondary | ICD-10-CM

## 2018-07-21 IMAGING — US US RENAL
1 series · 14 of 25 positions shown · non-contrast
Comparison: 01/18/2016

CLINICAL DATA: Chronic hydronephrosis.  Renal cyst.

EXAM:
RENAL / URINARY TRACT ULTRASOUND COMPLETE

[Series 1: us renal · 0.23mm/px · 14 of 53 slices shown]
[im 1/53]
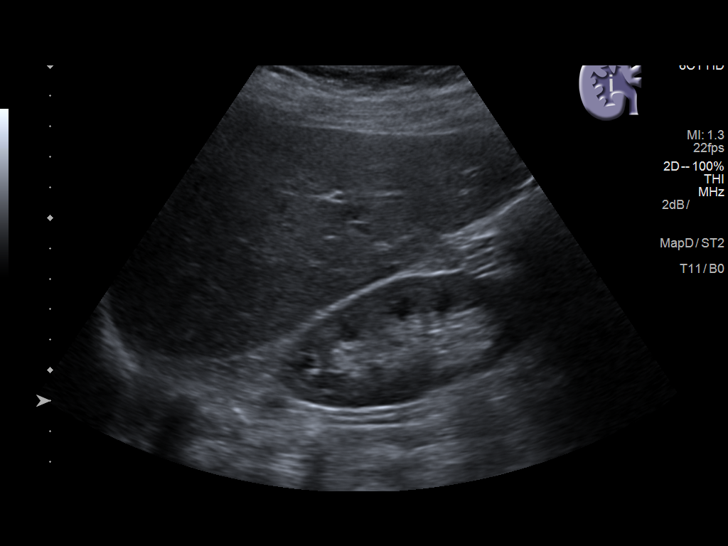
[im 5/53]
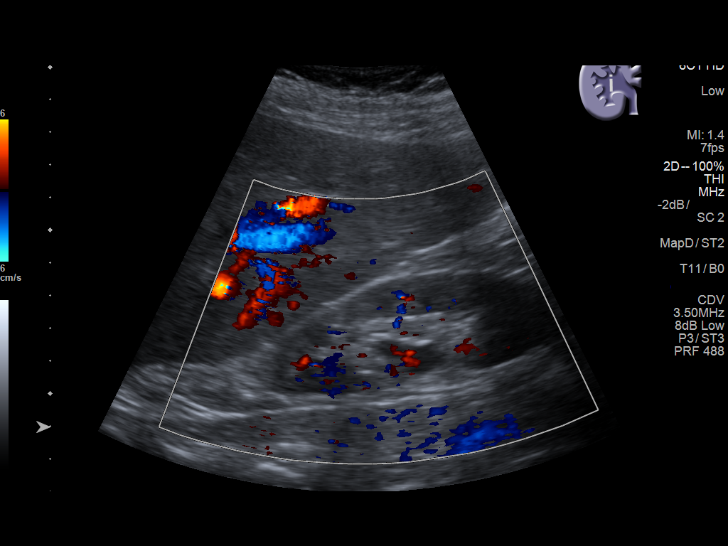
[im 9/53]
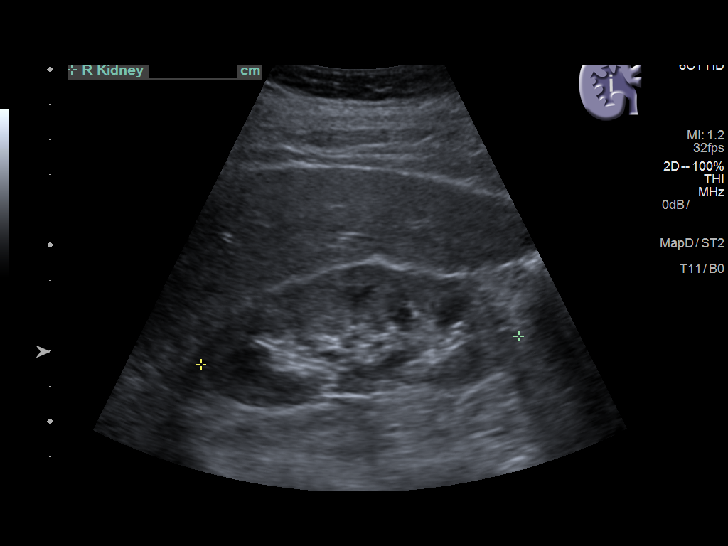
[im 14/53]
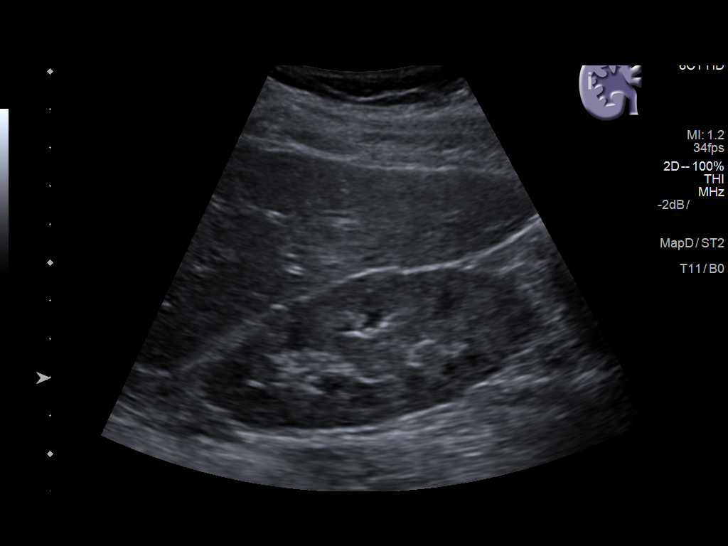
[im 18/53]
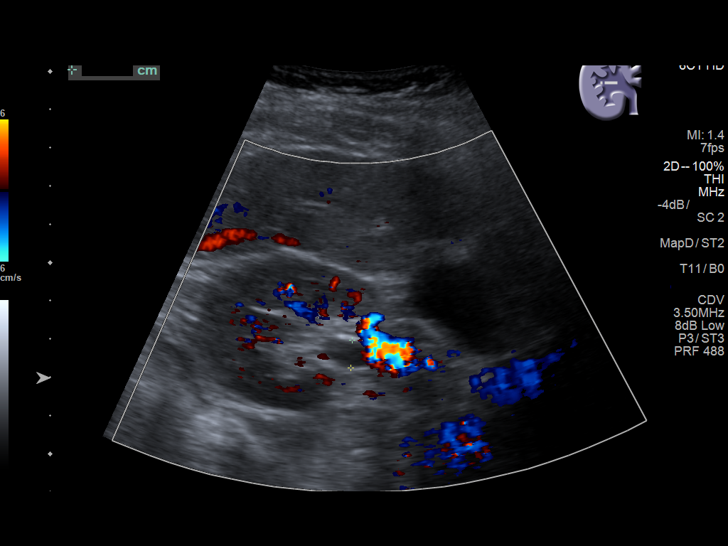
[im 20/53]
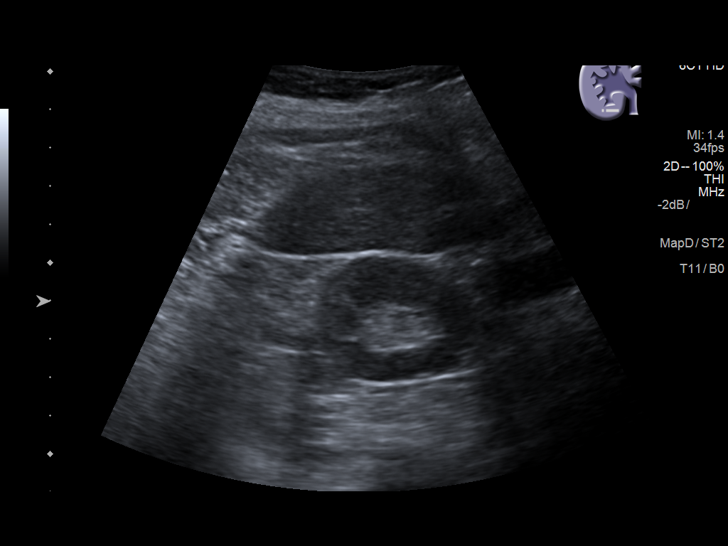
[im 24/53]
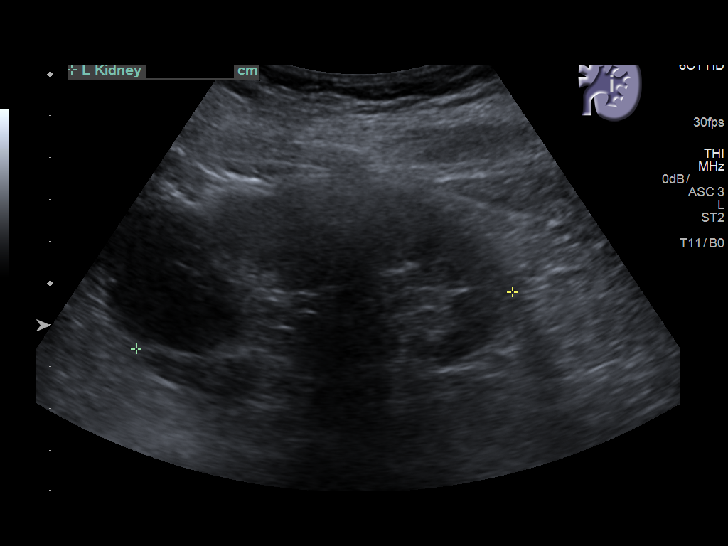
[im 29/53]
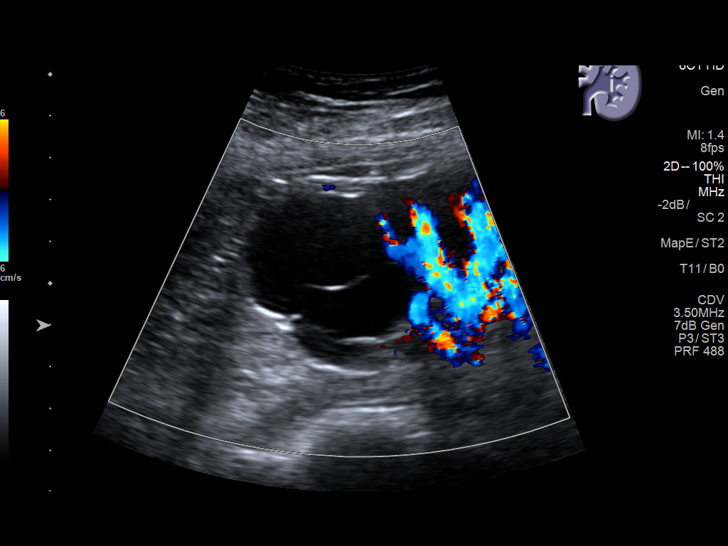
[im 33/53]
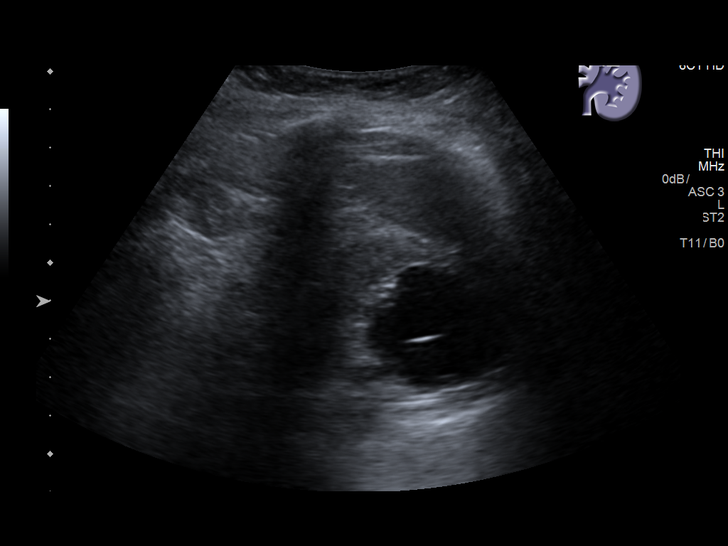
[im 35/53]
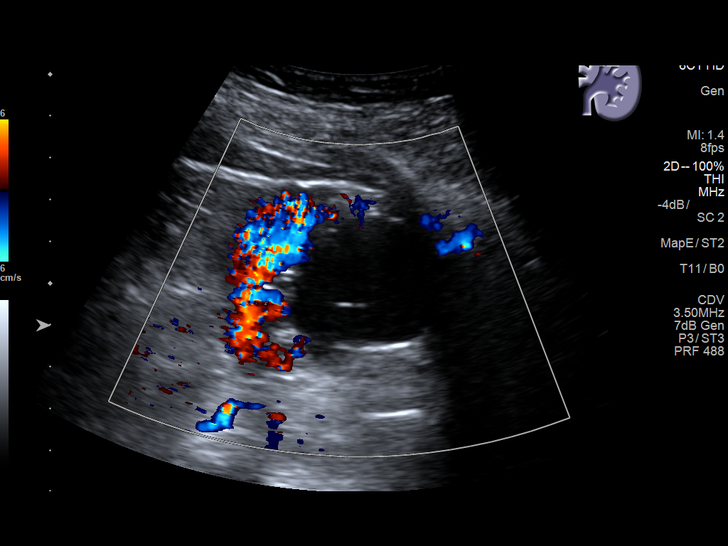
[im 40/53]
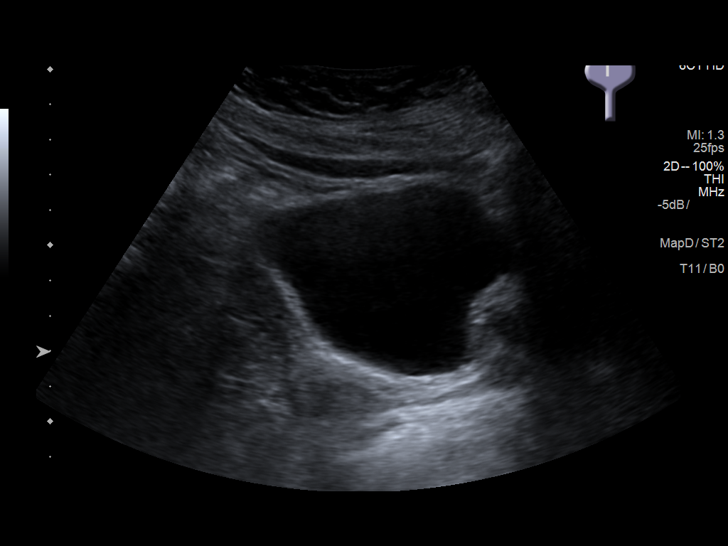
[im 44/53]
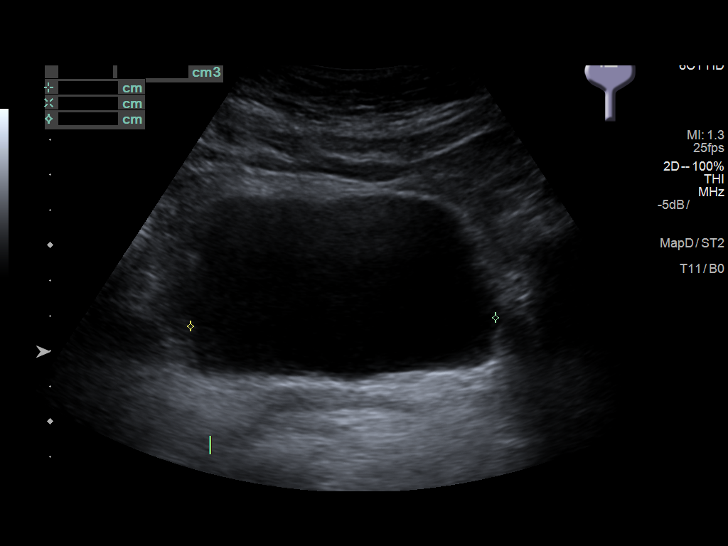
[im 48/53]
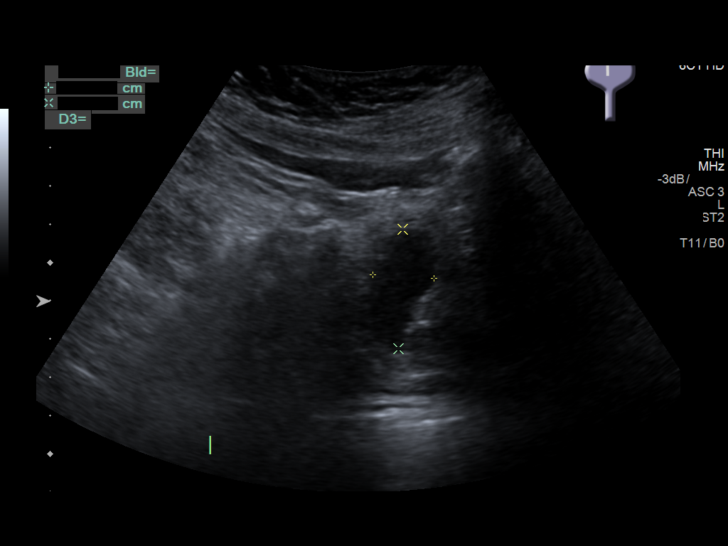
[im 53/53]
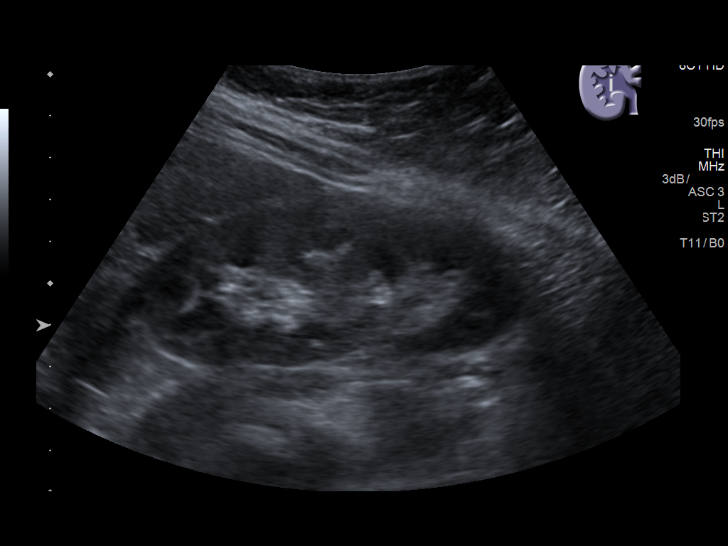

[14 of 25 positions shown; findings below may reference images not displayed]

FINDINGS: Right Kidney:

Length: 9 cm. Pelviectasis without hydronephrosis. Negative for
mass.

Left Kidney:

Length: 9.5 cm. Cyst in the upper pole measuring up to 4.2 cm with 2
thin septations and posterior lobulation. No detected change in size
or complexity. No hydronephrosis.

Bladder:

Appears normal for degree of bladder distention. Bilateral ureteral
jets. Pre and postvoid volume is 164 and 11 cc.
IMPRESSION: 1. Mild right pelviectasis is stable.  No hydronephrosis.
2. 4.2 cm lobulated and thinly septated left renal cyst. No
enlargement or progressive complexity compared to 12/22/2015.

## 2019-01-20 ENCOUNTER — Other Ambulatory Visit: Payer: Self-pay | Admitting: Internal Medicine

## 2019-01-20 DIAGNOSIS — Z1231 Encounter for screening mammogram for malignant neoplasm of breast: Secondary | ICD-10-CM

## 2019-05-16 ENCOUNTER — Ambulatory Visit: Payer: Medicare Other | Attending: Internal Medicine

## 2019-05-16 DIAGNOSIS — Z23 Encounter for immunization: Secondary | ICD-10-CM

## 2019-05-16 NOTE — Progress Notes (Signed)
   Covid-19 Vaccination Clinic  Name:  Erika Schwartz    MRN: [id number] DOB: Mar 06, 1945  05/16/2019  [name] was observed post Covid-19 immunization for 15 minutes without incident. She was provided with Vaccine Information Sheet and instruction to access the V-Safe system.   [name] was instructed to call 911 with any severe reactions post vaccine: Marland Kitchen Difficulty breathing  . Swelling of face and throat  . A fast heartbeat  . A bad rash all over body  . Dizziness and weakness   Immunizations Administered    Name Date Dose VIS Date Route   Pfizer COVID-19 Vaccine 05/16/2019  8:32 AM 0.3 mL 01/29/2019 Intramuscular   Manufacturer: [place]   Lot: [id number]   [place]: [id number]

## 2019-06-09 ENCOUNTER — Ambulatory Visit: Payer: Medicare Other | Attending: Internal Medicine

## 2019-06-09 DIAGNOSIS — Z23 Encounter for immunization: Secondary | ICD-10-CM

## 2019-06-09 NOTE — Progress Notes (Signed)
   Covid-19 Vaccination Clinic  Name:  AKILA HAYASHI    MRN: [id number] DOB: 1945/04/26  06/09/2019  Ms. Boysel was observed post Covid-19 immunization for 15 minutes without incident. She was provided with Vaccine Information Sheet and instruction to access the V-Safe system.   Ms. Roston was instructed to call 911 with any severe reactions post vaccine: Marland Kitchen Difficulty breathing  . Swelling of face and throat  . A fast heartbeat  . A bad rash all over body  . Dizziness and weakness   Immunizations Administered    Name Date Dose VIS Date Route   Pfizer COVID-19 Vaccine 06/09/2019  8:22 AM 0.3 mL 04/14/2018 Intramuscular   Manufacturer: [place]   Lot: [id number]   [place]: [id number]

## 2019-07-11 IMAGING — US US RENAL
1 series · 14 of 25 positions shown · non-contrast
Comparison: 01/17/2017

CLINICAL DATA: Hydronephrosis, renal cyst follow-up, stage III
chronic kidney disease, benign hypertension

EXAM:
RENAL / URINARY TRACT ULTRASOUND COMPLETE

[Series 1: us renal · 0.23mm/px · 14 of 36 slices shown]
[im 1/36]
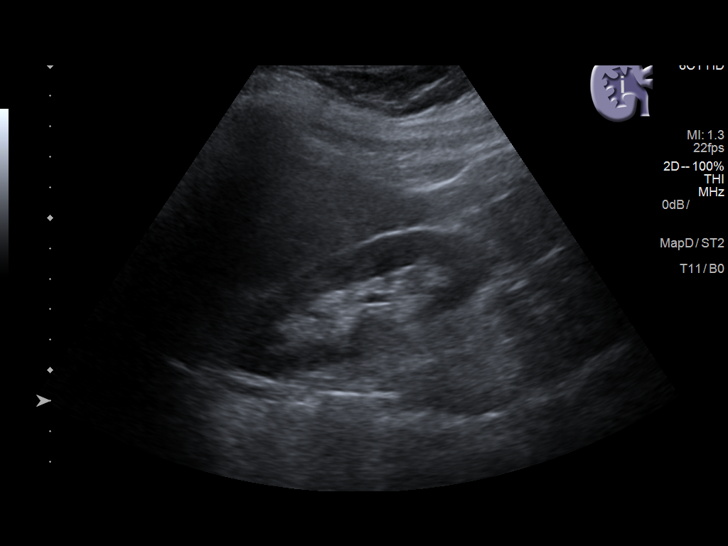
[im 3/36]
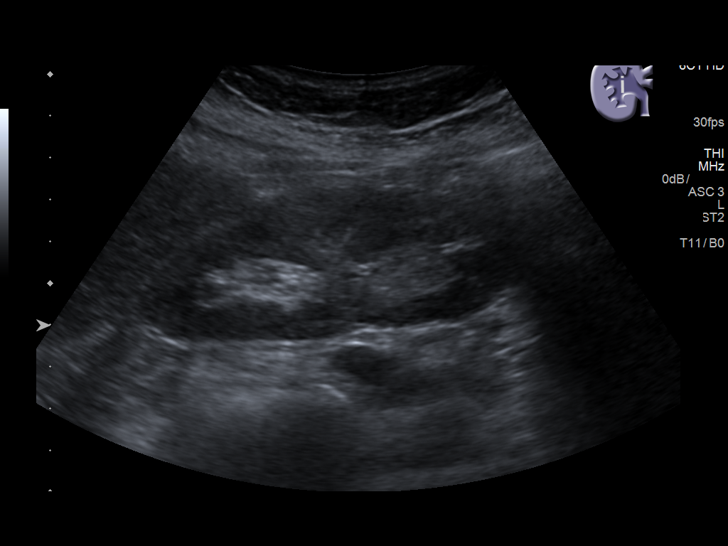
[im 6/36]
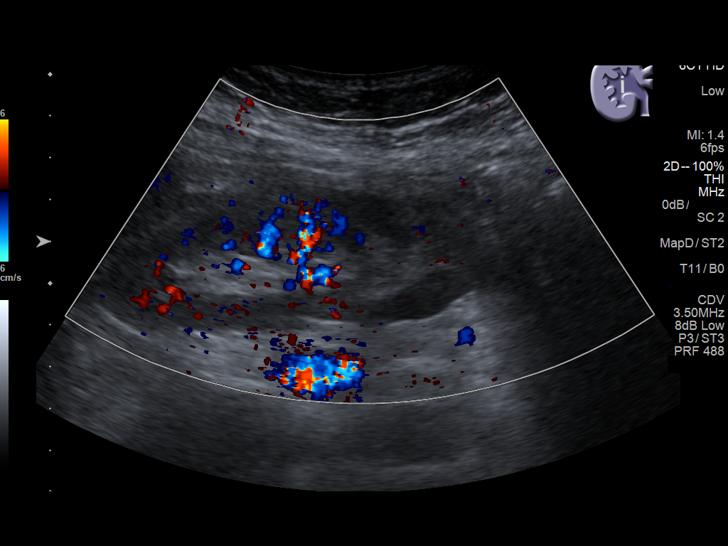
[im 9/36]
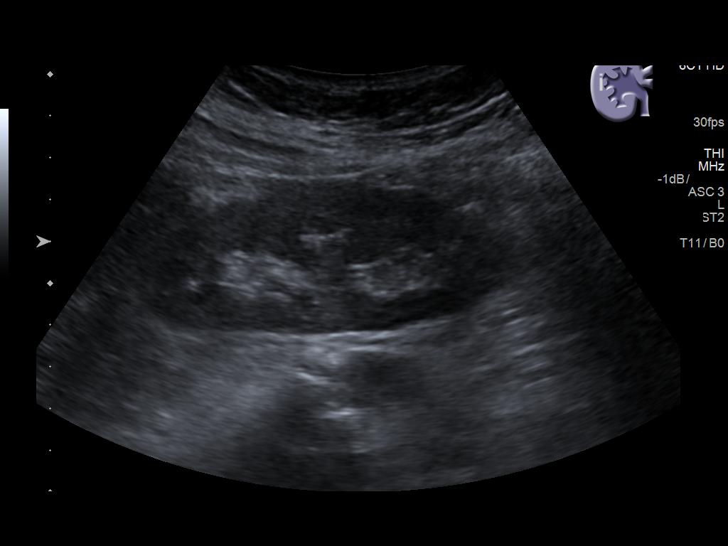
[im 12/36]
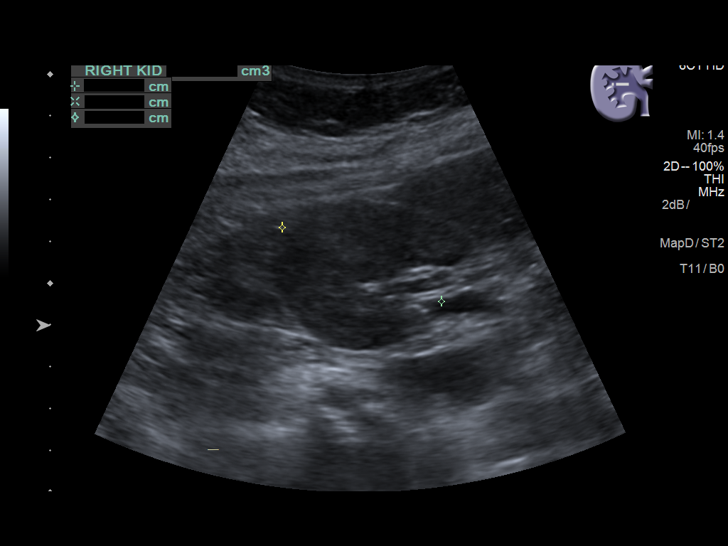
[im 14/36]
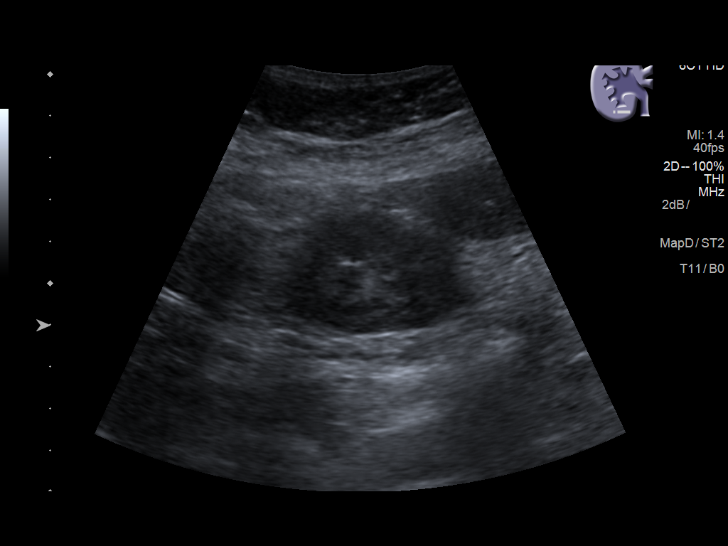
[im 17/36]
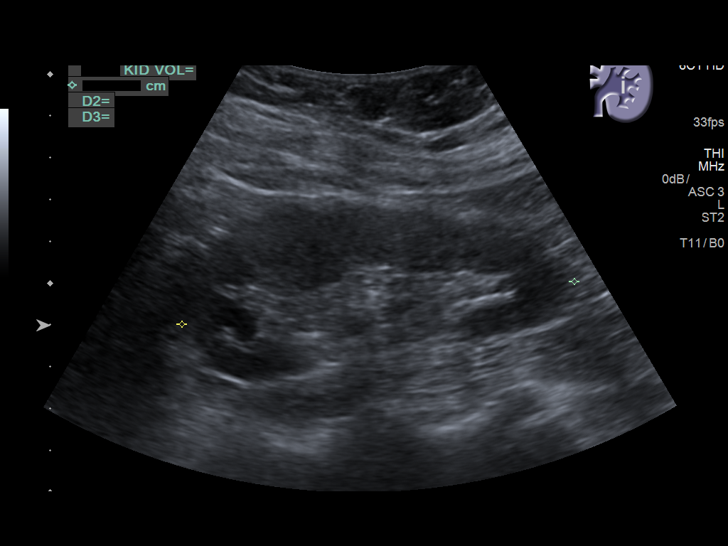
[im 19/36]
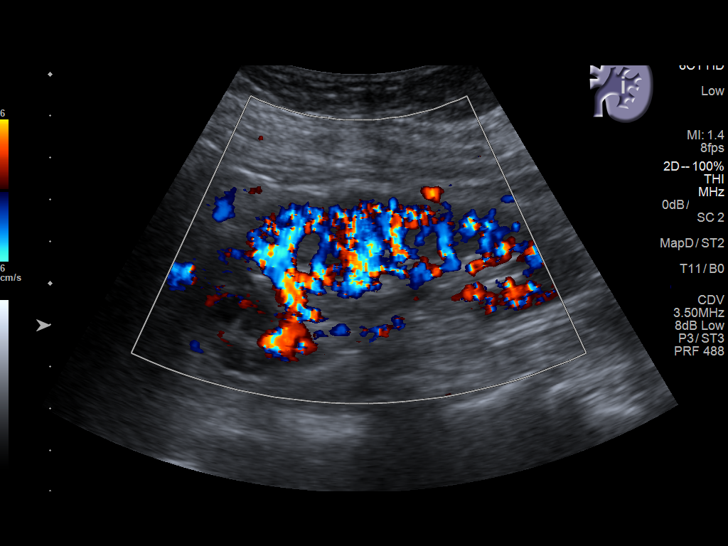
[im 22/36]
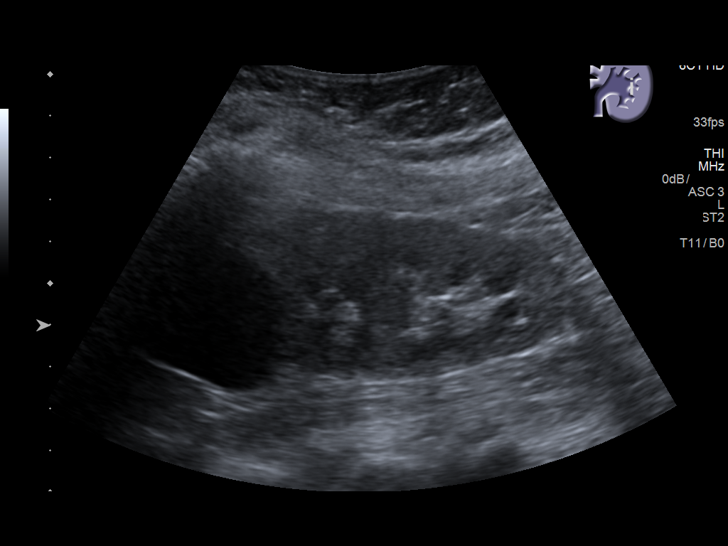
[im 24/36]
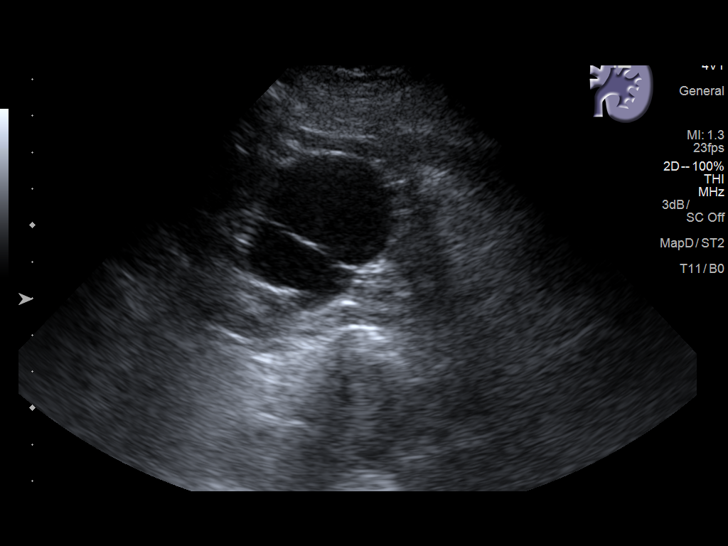
[im 27/36]
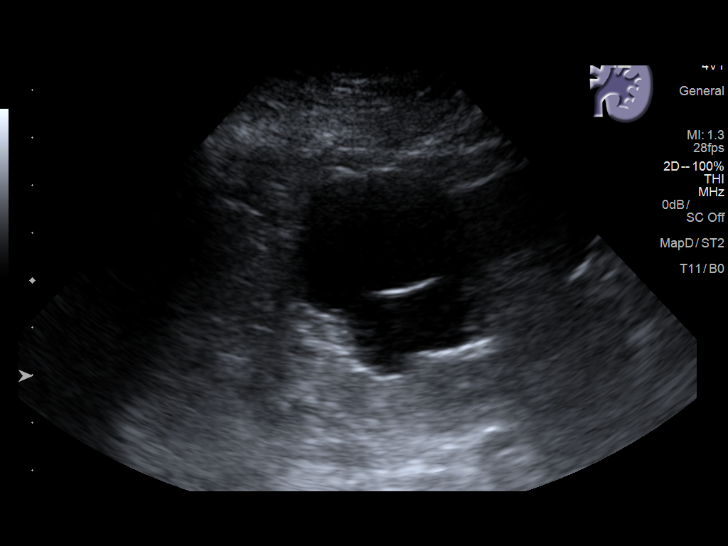
[im 30/36]
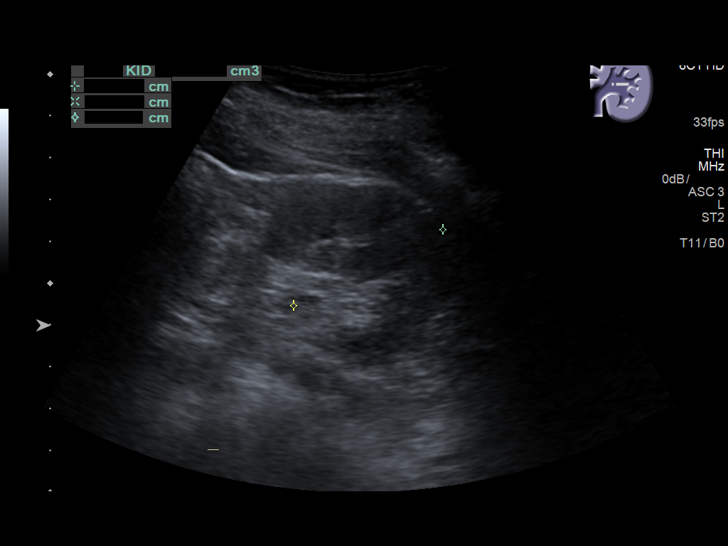
[im 33/36]
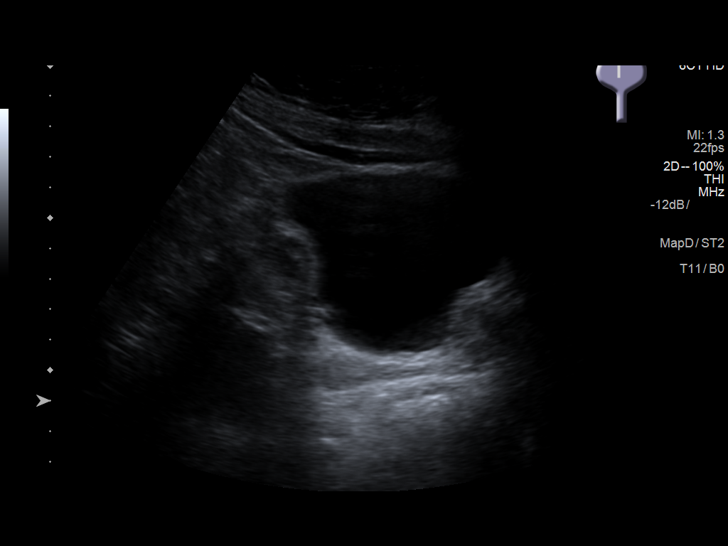
[im 36/36]
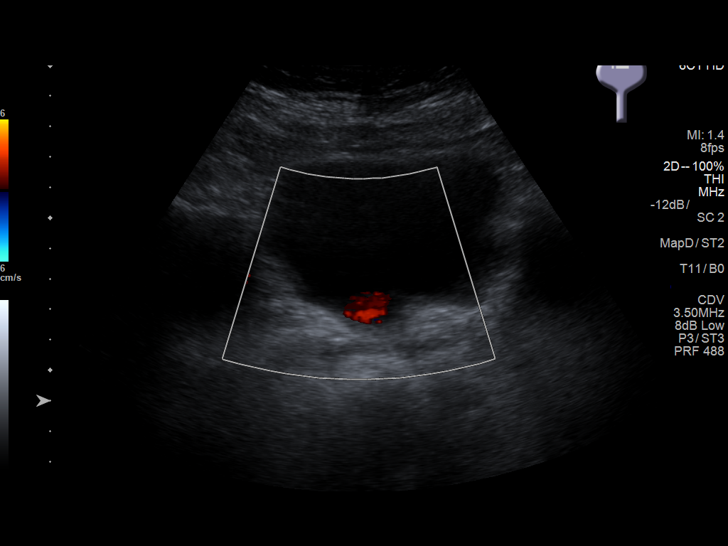

[14 of 25 positions shown; findings below may reference images not displayed]

FINDINGS: Right Kidney:

Renal measurements: 9.4 x 3.5 x 4.2 cm = volume: 72 mL. Normal
cortical thickness and echogenicity. Mild pelviectasis unchanged. No
significant calyceal dilatation, renal mass or shadowing
calcification.

Left Kidney:

Renal measurements: 9.4 x 3.7 x 4.0 cm = volume: 74 mL. Normal
cortical thickness and echogenicity. Mildly complicated cyst at
upper pole of LEFT kidney containing a single thin septation, lesion
overall measuring 4.2 x 3.2 x 4.0 cm previously 4.2 x 3.9 x 4.1 cm.
No mural nodularity within the observed lesion. No additional mass,
hydronephrosis or shadowing calcification.

Bladder:

Appears normal for degree of bladder distention. Ureteral jets
noted.
IMPRESSION: No significant interval change in appearance of a mildly complicated
cystic lesion at the upper pole LEFT kidney 4.2 x 3.2 x 4.0 cm,
containing a single thin septation.

Persistent mild RIGHT pelviectasis without hydronephrosis.

No new renal cell RIGHT foot abnormalities.

## 2020-08-03 ENCOUNTER — Telehealth: Payer: Self-pay | Admitting: Urology

## 2020-08-03 DIAGNOSIS — N281 Cyst of kidney, acquired: Secondary | ICD-10-CM

## 2020-08-03 NOTE — Telephone Encounter (Signed)
Pt called to make 2 yr follow up appt. Pt needs orders for RUS prior.

## 2020-08-04 NOTE — Telephone Encounter (Signed)
Orders placed patient notified.

## 2020-08-04 NOTE — Addendum Note (Signed)
Addended by: Verlene Mayer A on: 08/04/2020 11:49 AM   Modules accepted: Orders

## 2020-09-01 ENCOUNTER — Ambulatory Visit: Payer: Medicare Other

## 2020-09-06 ENCOUNTER — Other Ambulatory Visit: Payer: Self-pay

## 2020-09-06 ENCOUNTER — Ambulatory Visit
Admission: RE | Admit: 2020-09-06 | Discharge: 2020-09-06 | Disposition: A | Discharge status: Home / Self Care | Payer: Medicare Other | Source: Ambulatory Visit | Attending: Urology | Admitting: Urology

## 2020-09-06 ENCOUNTER — Ambulatory Visit: Payer: Medicare Other | Admitting: Urology

## 2020-09-06 DIAGNOSIS — N281 Cyst of kidney, acquired: Secondary | ICD-10-CM | POA: Insufficient documentation

## 2020-09-12 ENCOUNTER — Other Ambulatory Visit: Payer: Self-pay

## 2020-09-12 ENCOUNTER — Encounter: Payer: Self-pay | Admitting: Urology

## 2020-09-12 ENCOUNTER — Ambulatory Visit (INDEPENDENT_AMBULATORY_CARE_PROVIDER_SITE_OTHER): Payer: Medicare Other | Admitting: Urology

## 2020-09-12 VITALS — BP 138/82 | HR 78 | Ht 62.0 in | Wt 156.0 lb

## 2020-09-12 DIAGNOSIS — N1831 Chronic kidney disease, stage 3a: Secondary | ICD-10-CM | POA: Diagnosis not present

## 2020-09-12 DIAGNOSIS — N281 Cyst of kidney, acquired: Secondary | ICD-10-CM

## 2020-09-12 NOTE — Progress Notes (Signed)
09/12/2020 2:43 PM   Bing Quarry 05/08/1945 [id number]  Referring provider: Kirk Ruths, MD [facility] [place],  [place] [address]  Chief Complaint  Patient presents with   Hydronephrosis    HPI: 75 year old female returns today for 2-year follow-up.  She has an incidental left Bosniak 2 renal cyst which is being followed with serial imaging by ultrasound. She also has a personal history of mild bilateral hydronephrosis, right greater than left with a normal renal function.  Renal function has remained stable, most recent creatinine 1.1 as of 4/222.  Has not seen Dr. Juleen China in quite some time.    She denies any flank pain or urinary issues.  Follow-up MRI on 09/06/2020 images were personally reviewed.  She has a stable mildly complicated 4 x 4 0.6 x 4 x 4 cm left renal cyst.  There is no hydronephrosis today.    PMH: Past Medical History:  Diagnosis Date   Acquired hypothyroidism 12/08/2013   Last Assessment & Plan:  Tsh is followed and energy is doing well.     Benign hypertension 07/13/2013   Last Assessment & Plan:  Is compliant with hypertensive medications without clear side effects or lack of control.  Nausea and itching are not symptomatic and nsaids are being avoided.      Chronic kidney disease (CKD), stage III (moderate) ([place]) 12/06/2014   HLD (hyperlipidemia) 07/13/2013   Pure hypercholesterolemia 07/13/2013   Last Assessment & Plan:  Appropriate diet is being attempted and myalgia's are not noted.      Skin cancer     Surgical History: Past Surgical History:  Procedure Laterality Date   BREAST CYST EXCISION     DILATION AND CURETTAGE OF UTERUS     TONSILLECTOMY      Home Medications:  Allergies as of 09/12/2020   No Known Allergies      Medication List        Accurate as of September 12, 2020 11:59 PM. If you have any questions, ask your nurse or doctor.          STOP taking these medications     aspirin 81 MG chewable tablet Stopped by: Hollice Espy, MD   Ginkgo Biloba 40 MG Tabs Stopped by: Hollice Espy, MD       TAKE these medications    CALCIUM 600 + MINERALS PO Take by mouth.   Cholecalciferol 25 MCG (1000 UT) tablet Take by mouth.   L-Lysine 500 MG Tabs Take by mouth.   latanoprost 0.005 % ophthalmic solution Commonly known as: XALATAN   levothyroxine 25 MCG tablet Commonly known as: SYNTHROID Take by mouth.   losartan-hydrochlorothiazide 50-12.5 MG tablet Commonly known as: HYZAAR Take by mouth.   magnesium 30 MG tablet Take 30 mg by mouth 2 (two) times daily.   Multi-Vitamins Tabs   niacin 500 MG tablet Take by mouth.   pravastatin 10 MG tablet Commonly known as: PRAVACHOL Take by mouth.   QC TUMERIC COMPLEX PO Take by mouth.   timolol 0.5 % ophthalmic solution Commonly known as: TIMOPTIC   vitamin E 180 MG (400 UNITS) capsule Take by mouth.        Allergies: No Known Allergies  Family History: Family History  Problem Relation Age of Onset   Bladder Cancer Neg Hx    Prostate cancer Father    Kidney cancer Neg Hx     Social History:  reports that she has never smoked. She  has never used smokeless tobacco. She reports current alcohol use. She reports that she does not use drugs.   Physical Exam: BP 138/82   Pulse 78   Ht '5\' 2"'$  (1.575 m)   Wt 156 lb (70.8 kg)   BMI 28.53 kg/m   Constitutional:  Alert and oriented, No acute distress. HEENT: [place] AT, moist mucus membranes.  Trachea midline, no masses. Cardiovascular: No clubbing, cyanosis, or edema. Respiratory: Normal respiratory effort, no increased work of breathing.. Skin: No rashes, bruises or suspicious lesions. Neurologic: Grossly intact, no focal deficits, moving all 4 extremities. Psychiatric: Normal mood and affect.  Laboratory Data:  Lab Results  Component Value Date   CREATININE 1.10 (H) 01/25/2016   Urinalysis    Component Value Date/Time    APPEARANCEUR Clear 07/24/2015 1117   GLUCOSEU Negative 07/24/2015 1117   BILIRUBINUR Negative 07/24/2015 1117   PROTEINUR Negative 07/24/2015 1117   NITRITE Negative 07/24/2015 1117   LEUKOCYTESUR Trace (A) 07/24/2015 1117    Lab Results  Component Value Date   LABMICR See below: 07/24/2015   WBCUA 0-5 07/24/2015   RBCUA None seen 07/24/2015   LABEPIT 0-10 07/24/2015   BACTERIA None seen 07/24/2015    Pertinent Imaging: Ultrasound renal complete  Narrative CLINICAL DATA:  Left renal cyst  EXAM: RENAL / URINARY TRACT ULTRASOUND COMPLETE  COMPARISON:  01/07/2018, 01/17/2017  FINDINGS: Right Kidney:  Renal measurements: 9 x 3.2 x 4.2 cm = volume: 62 mL. Echogenicity within normal limits. No mass or hydronephrosis visualized.  Left Kidney:  Renal measurements: 9.1 x 4 x 4.1 cm = volume: 77.9 mL. Echogenicity within normal limits. No solid mass or hydronephrosis visualized. 4 x 4.6 x 4.4 cm cystic left upper pole renal mass with a thin septation most consistent with a mildly complicated cyst.  Bladder:  Appears normal for degree of bladder distention.  Other:  None.  IMPRESSION: 1. Stable mildly complicated left renal cyst measuring 4 x 4.6 x 4.4 Cm. 2. No obstructive uropathy.   Electronically Signed By: Kathreen Devoid On: 09/07/2020 07:20  Images personally reviewed as above  Assessment & Plan:    1. Renal cyst Stable for centimeter left Bosniak 2 renal cyst, slightly complex  We have agreed to continue imaging every 2 years, we will follow-up in 2 years with a renal ultrasound  2. Stage 3a chronic kidney disease ([place]) Creatinine stable   Hollice Espy, MD  [facility] [address], [place], [place] [address] [phone]

## 2021-07-21 IMAGING — US US RENAL
1 series · 14 of 25 positions shown · non-contrast
Comparison: 01/07/2018, 01/17/2017

CLINICAL DATA: Left renal cyst

EXAM:
RENAL / URINARY TRACT ULTRASOUND COMPLETE

[Series 1: us renal · 0.20mm/px · 47 acquisitions, 14 frames shown]
[im 1/47]
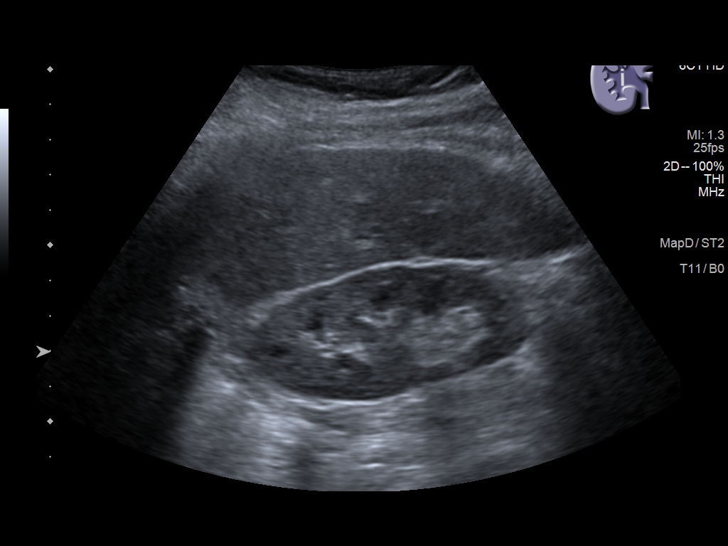
[im 4/47]
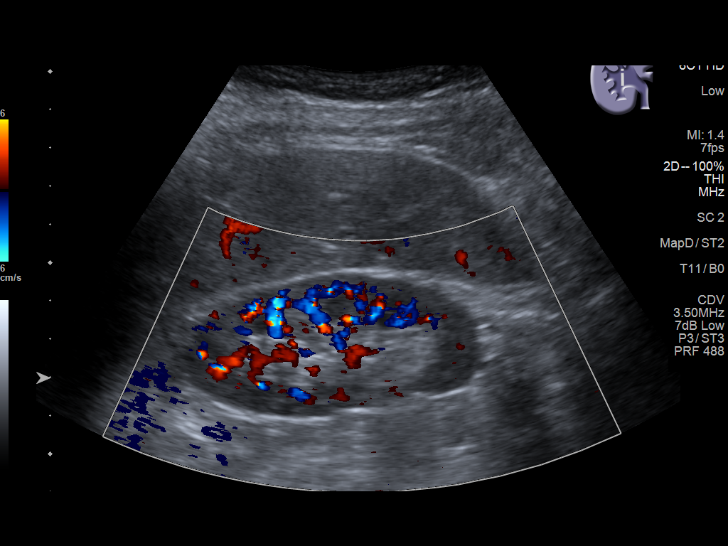
[im 8/47]
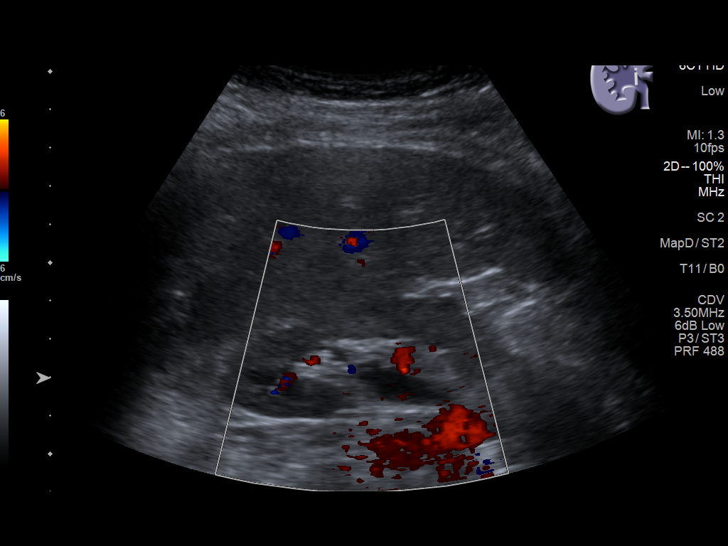
[im 12/47]
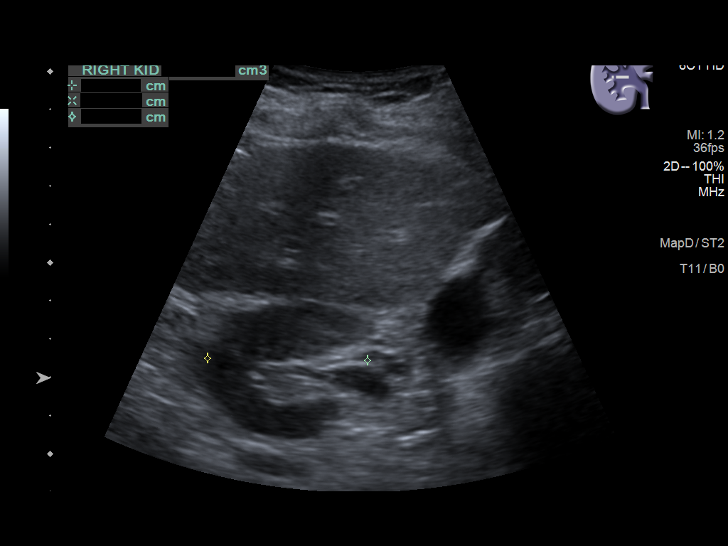
[im 16/47]
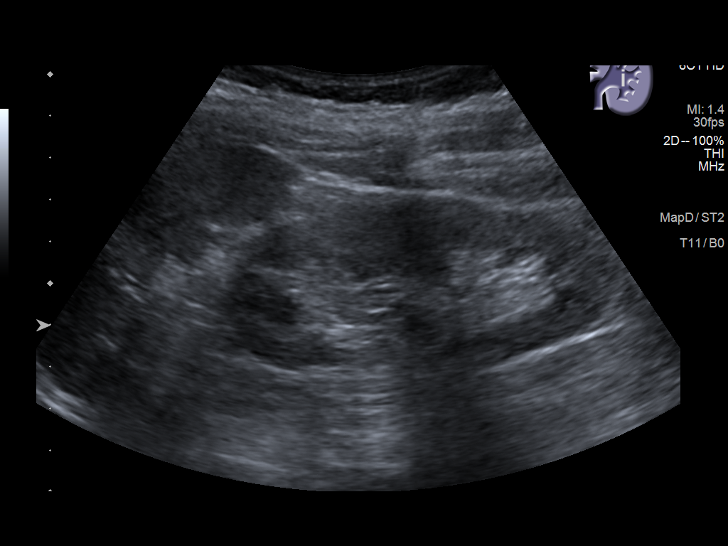
[im 18/47]
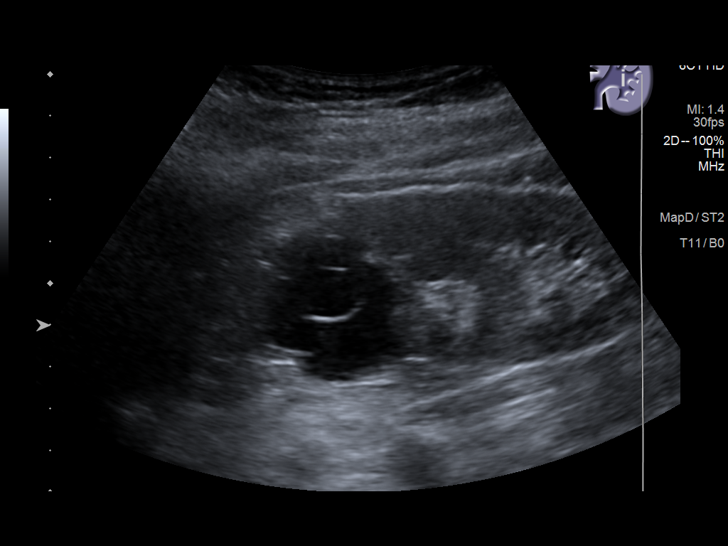
[im 22/47]
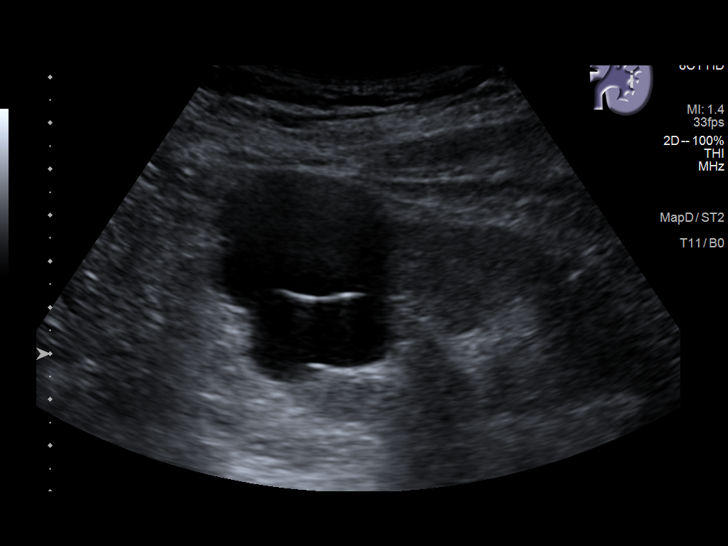
[im 25/47]
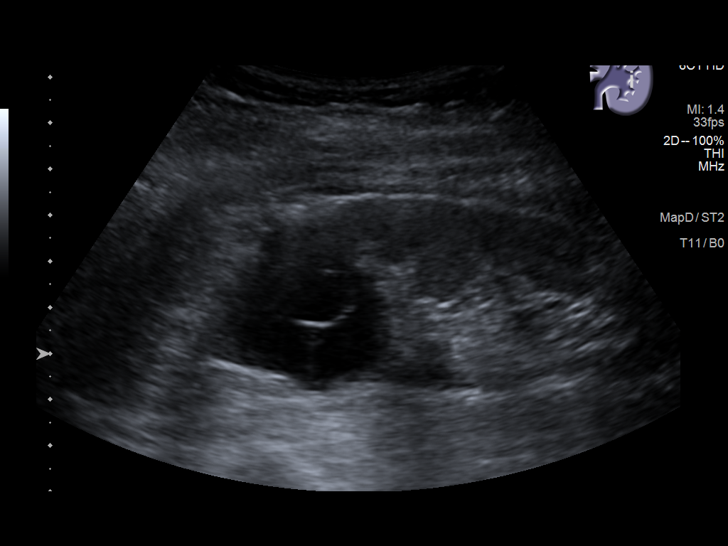
[im 29/47]
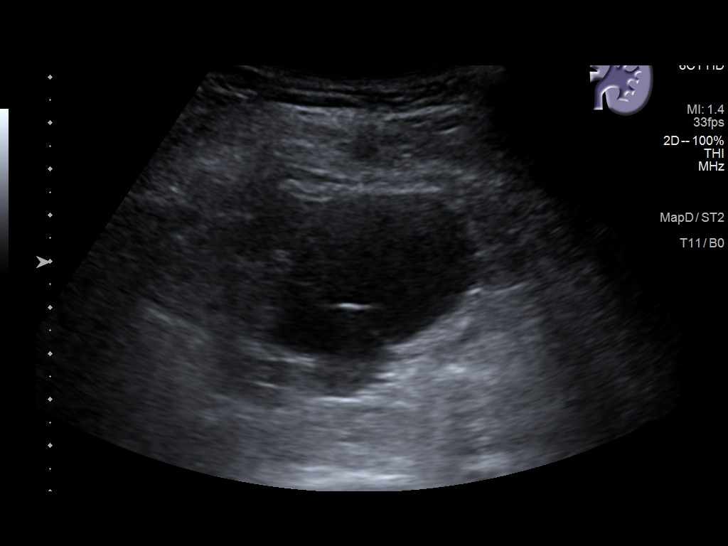
[im 31/47]
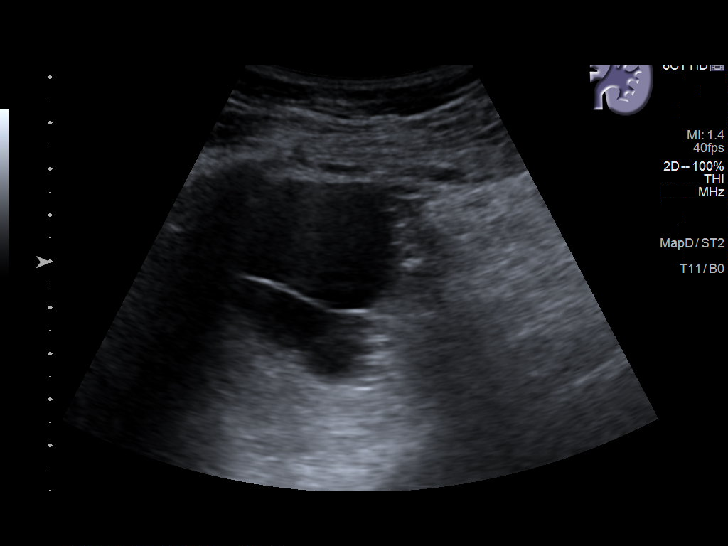
[im 35/47]
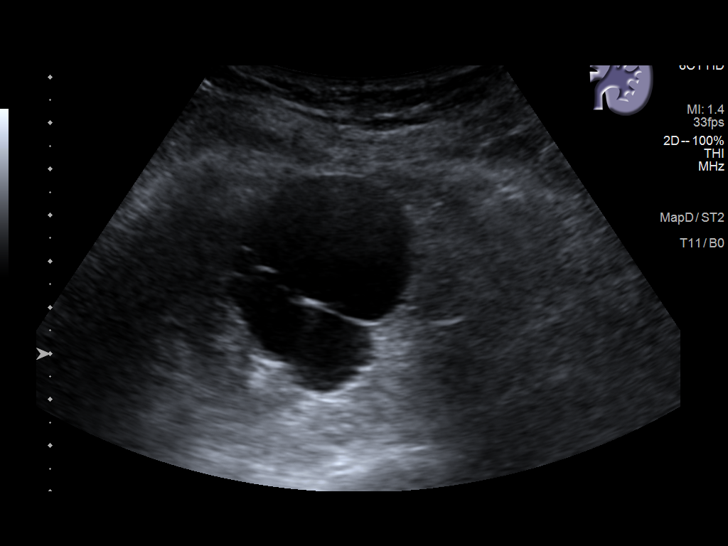
[im 39/47]
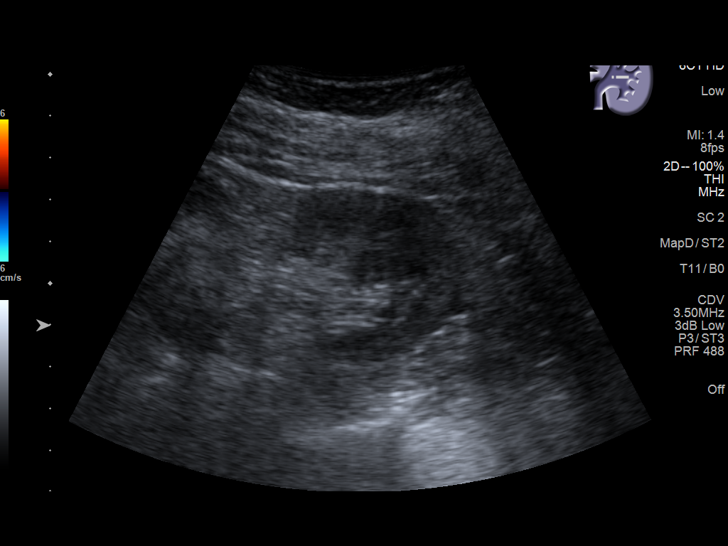
[im 43/47]
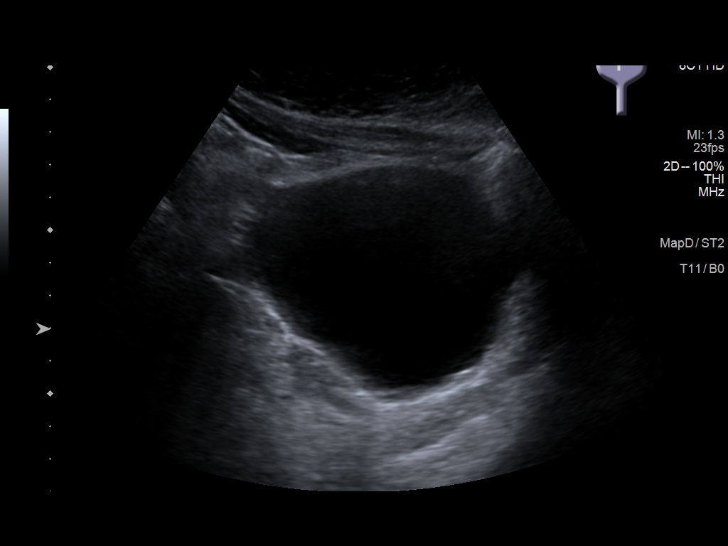
[im 47/47]
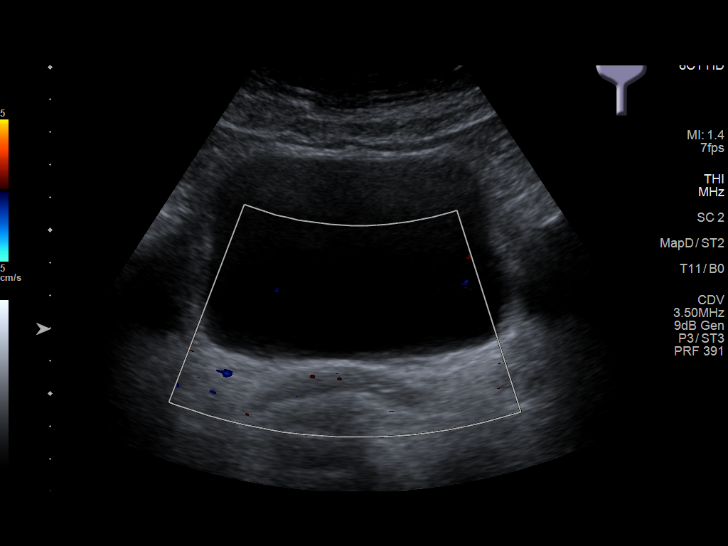

[14 of 25 positions shown; findings below may reference images not displayed]

FINDINGS: Right Kidney:

Renal measurements: 9 x 3.2 x 4.2 cm = volume: 62 mL. Echogenicity
within normal limits. No mass or hydronephrosis visualized.

Left Kidney:

Renal measurements: 9.1 x 4 x 4.1 cm = volume: 77.9 mL. Echogenicity
within normal limits. No solid mass or hydronephrosis visualized. 4
x 4.6 x 4.4 cm cystic left upper pole renal mass with a thin
septation most consistent with a mildly complicated cyst.

Bladder:

Appears normal for degree of bladder distention.

Other:

None.
IMPRESSION: 1. Stable mildly complicated left renal cyst measuring 4 x 4.6 x
Cm.
2. No obstructive uropathy.

## 2022-05-03 ENCOUNTER — Telehealth: Payer: Self-pay | Admitting: Urology

## 2022-05-03 DIAGNOSIS — N281 Cyst of kidney, acquired: Secondary | ICD-10-CM

## 2022-05-03 NOTE — Telephone Encounter (Signed)
Order placed as requested. She is not due until July for [place] and follow up. Please schedule for follow up mid to end of July. Scheduler will call her in June to schedule [place]

## 2022-05-03 NOTE — Telephone Encounter (Signed)
Patient called regarding scheduling her 2 year follow up appt with Dr. Erlene Quan. Per last visit note; it will be a 2 yr follow up with Renal US. There is no current order for this in her chart. She stated that she will only go to [place] for her [place]. Please advise.

## 2022-08-05 ENCOUNTER — Ambulatory Visit: Payer: Medicare Other

## 2022-09-10 ENCOUNTER — Ambulatory Visit: Payer: Medicare Other | Admitting: Urology

## 2022-10-14 ENCOUNTER — Ambulatory Visit
Admission: RE | Admit: 2022-10-14 | Discharge: 2022-10-14 | Disposition: A | Discharge status: Home / Self Care | Payer: Medicare Other | Source: Ambulatory Visit | Attending: Urology | Admitting: Urology

## 2022-10-14 DIAGNOSIS — N281 Cyst of kidney, acquired: Secondary | ICD-10-CM | POA: Diagnosis present

## 2022-10-17 ENCOUNTER — Encounter: Payer: Self-pay | Admitting: Urology

## 2022-10-17 ENCOUNTER — Ambulatory Visit: Payer: Medicare Other | Admitting: Urology

## 2022-10-17 VITALS — BP 115/73 | HR 62 | Ht 63.0 in | Wt 123.0 lb

## 2022-10-17 DIAGNOSIS — N281 Cyst of kidney, acquired: Secondary | ICD-10-CM

## 2022-10-17 NOTE — Progress Notes (Signed)
Marcelle Overlie Plume,acting as a scribe for Vanna Scotland, MD.,have documented all relevant documentation on the behalf of Vanna Scotland, MD,as directed by  Vanna Scotland, MD while in the presence of Vanna Scotland, MD.  10/17/2022 11:54 AM   Erika Schwartz 13-Aug-1945 [id number]  Referring provider: Lauro Regulus, MD [address] [facility] [place] [facility] [address] [place],  [place] [address]  Chief Complaint  Patient presents with   Results    HPI: 77 year-old female who presents today for a 2 year follow up.   She was last seen in 08/2020. We have been following an incidental Bosniak 2 renal cyst via serial ultrasound. She also has a personal history of mild bilateral hydronephrosis, right greater than left. She follows up today with repeat imaging, which has not been interpreted yet.   Her most recent creatinine from 06/24/2022 was 1.0.   She reports the recent passing of her 63 year old mother, who had been suffering from dementia and severe hallucinations.  PMH: Past Medical History:  Diagnosis Date   Acquired hypothyroidism 12/08/2013   Last Assessment & Plan:  Tsh is followed and energy is doing well.     Benign hypertension 07/13/2013   Last Assessment & Plan:  Is compliant with hypertensive medications without clear side effects or lack of control.  Nausea and itching are not symptomatic and nsaids are being avoided.      Chronic kidney disease (CKD), stage III (moderate) (HCC) 12/06/2014   HLD (hyperlipidemia) 07/13/2013   Pure hypercholesterolemia 07/13/2013   Last Assessment & Plan:  Appropriate diet is being attempted and myalgia's are not noted.      Skin cancer     Surgical History: Past Surgical History:  Procedure Laterality Date   BREAST CYST EXCISION     DILATION AND CURETTAGE OF UTERUS     TONSILLECTOMY      Home Medications:  Allergies as of 10/17/2022   No Known Allergies      Medication List        Accurate as of October 17, 2022 11:54 AM.  If you have any questions, ask your nurse or doctor.          CALCIUM 600 + MINERALS PO Take by mouth.   Cholecalciferol 25 MCG (1000 UT) tablet Take by mouth.   L-Lysine 500 MG Tabs Take by mouth.   latanoprost 0.005 % ophthalmic solution Commonly known as: XALATAN   levothyroxine 25 MCG tablet Commonly known as: SYNTHROID Take by mouth.   losartan-hydrochlorothiazide 50-12.5 MG tablet Commonly known as: HYZAAR Take by mouth.   magnesium 30 MG tablet Take 30 mg by mouth 2 (two) times daily.   Multi-Vitamins Tabs   niacin 500 MG tablet Commonly known as: VITAMIN B3 Take by mouth.   pravastatin 10 MG tablet Commonly known as: PRAVACHOL Take by mouth.   QC TUMERIC COMPLEX PO Take by mouth.   timolol 0.5 % ophthalmic solution Commonly known as: TIMOPTIC   vitamin E 180 MG (400 UNITS) capsule Take by mouth.        Family History: Family History  Problem Relation Age of Onset   Bladder Cancer Neg Hx    Prostate cancer Father    Kidney cancer Neg Hx     Social History:  reports that she has never smoked. She has never used smokeless tobacco. She reports current alcohol use. She reports that she does not use drugs.   Physical Exam: BP 115/73   Pulse 62  Ht 5\' 3"  (1.6 m)   Wt 123 lb (55.8 kg)   BMI 21.79 kg/m   Constitutional:  Alert and oriented, No acute distress. HEENT:  AT, moist mucus membranes.  Trachea midline, no masses Neurologic: Grossly intact, no focal deficits, moving all 4 extremities. Psychiatric: Normal mood and affect.  Pertinent Imaging:  Renal Ultrasound  I personally reviewed her renal ultrasound report. Radiologic interpretation is pending. I do see a complex left renal cyst that appears grossly stable from two years ago. No appreciable hydronephrosis. I do see bilateral ureteral jets.  Assessment & Plan:    1. Renal cyst - Awaiting final radiologic interpretation - If it is stable now for 2 years and there is no  additional complexity as per radiology report, we will defer further imaging.   - There is no more hydronephrosis and her creatinine is stable - We will likely see her as need based on the finalized radiology report  Return if symptoms worsen or fail to improve.  I have reviewed the above documentation for accuracy and completeness, and I agree with the above.   Vanna Scotland, MD    [facility] [address], [address] [place], [place] [address] [phone]

## 2023-01-08 DIAGNOSIS — I129 Hypertensive chronic kidney disease with stage 1 through stage 4 chronic kidney disease, or unspecified chronic kidney disease: Secondary | ICD-10-CM | POA: Diagnosis not present

## 2023-01-08 DIAGNOSIS — N183 Chronic kidney disease, stage 3 unspecified: Secondary | ICD-10-CM | POA: Diagnosis not present

## 2023-01-08 DIAGNOSIS — E039 Hypothyroidism, unspecified: Secondary | ICD-10-CM | POA: Diagnosis not present

## 2023-01-08 DIAGNOSIS — R7303 Prediabetes: Secondary | ICD-10-CM | POA: Diagnosis not present

## 2023-01-15 DIAGNOSIS — Z1331 Encounter for screening for depression: Secondary | ICD-10-CM | POA: Diagnosis not present

## 2023-01-15 DIAGNOSIS — E039 Hypothyroidism, unspecified: Secondary | ICD-10-CM | POA: Diagnosis not present

## 2023-01-15 DIAGNOSIS — Z0000 Encounter for general adult medical examination without abnormal findings: Secondary | ICD-10-CM | POA: Diagnosis not present

## 2023-01-15 DIAGNOSIS — N183 Chronic kidney disease, stage 3 unspecified: Secondary | ICD-10-CM | POA: Diagnosis not present

## 2023-01-15 DIAGNOSIS — R7303 Prediabetes: Secondary | ICD-10-CM | POA: Diagnosis not present

## 2023-01-15 DIAGNOSIS — E7800 Pure hypercholesterolemia, unspecified: Secondary | ICD-10-CM | POA: Diagnosis not present

## 2023-01-15 DIAGNOSIS — I129 Hypertensive chronic kidney disease with stage 1 through stage 4 chronic kidney disease, or unspecified chronic kidney disease: Secondary | ICD-10-CM | POA: Diagnosis not present

## 2023-03-31 DIAGNOSIS — Z85828 Personal history of other malignant neoplasm of skin: Secondary | ICD-10-CM | POA: Diagnosis not present

## 2023-03-31 DIAGNOSIS — Z08 Encounter for follow-up examination after completed treatment for malignant neoplasm: Secondary | ICD-10-CM | POA: Diagnosis not present

## 2023-03-31 DIAGNOSIS — L821 Other seborrheic keratosis: Secondary | ICD-10-CM | POA: Diagnosis not present

## 2023-03-31 DIAGNOSIS — L308 Other specified dermatitis: Secondary | ICD-10-CM | POA: Diagnosis not present

## 2023-04-03 DIAGNOSIS — H40153 Residual stage of open-angle glaucoma, bilateral: Secondary | ICD-10-CM | POA: Diagnosis not present

## 2023-04-03 DIAGNOSIS — H524 Presbyopia: Secondary | ICD-10-CM | POA: Diagnosis not present

## 2023-07-09 DIAGNOSIS — N183 Chronic kidney disease, stage 3 unspecified: Secondary | ICD-10-CM | POA: Diagnosis not present

## 2023-07-09 DIAGNOSIS — E7800 Pure hypercholesterolemia, unspecified: Secondary | ICD-10-CM | POA: Diagnosis not present

## 2023-07-09 DIAGNOSIS — E039 Hypothyroidism, unspecified: Secondary | ICD-10-CM | POA: Diagnosis not present

## 2023-07-09 DIAGNOSIS — I129 Hypertensive chronic kidney disease with stage 1 through stage 4 chronic kidney disease, or unspecified chronic kidney disease: Secondary | ICD-10-CM | POA: Diagnosis not present

## 2023-07-09 DIAGNOSIS — R7303 Prediabetes: Secondary | ICD-10-CM | POA: Diagnosis not present

## 2023-07-16 DIAGNOSIS — R7303 Prediabetes: Secondary | ICD-10-CM | POA: Diagnosis not present

## 2023-07-16 DIAGNOSIS — N183 Chronic kidney disease, stage 3 unspecified: Secondary | ICD-10-CM | POA: Diagnosis not present

## 2023-07-16 DIAGNOSIS — E7800 Pure hypercholesterolemia, unspecified: Secondary | ICD-10-CM | POA: Diagnosis not present

## 2023-07-16 DIAGNOSIS — E039 Hypothyroidism, unspecified: Secondary | ICD-10-CM | POA: Diagnosis not present

## 2023-07-16 DIAGNOSIS — I129 Hypertensive chronic kidney disease with stage 1 through stage 4 chronic kidney disease, or unspecified chronic kidney disease: Secondary | ICD-10-CM | POA: Diagnosis not present

## 2023-08-04 DIAGNOSIS — H40153 Residual stage of open-angle glaucoma, bilateral: Secondary | ICD-10-CM | POA: Diagnosis not present

## 2023-09-29 DIAGNOSIS — D2272 Melanocytic nevi of left lower limb, including hip: Secondary | ICD-10-CM | POA: Diagnosis not present

## 2023-09-29 DIAGNOSIS — D225 Melanocytic nevi of trunk: Secondary | ICD-10-CM | POA: Diagnosis not present

## 2023-09-29 DIAGNOSIS — D2261 Melanocytic nevi of right upper limb, including shoulder: Secondary | ICD-10-CM | POA: Diagnosis not present

## 2023-09-29 DIAGNOSIS — Z85828 Personal history of other malignant neoplasm of skin: Secondary | ICD-10-CM | POA: Diagnosis not present

## 2023-09-29 DIAGNOSIS — Z08 Encounter for follow-up examination after completed treatment for malignant neoplasm: Secondary | ICD-10-CM | POA: Diagnosis not present

## 2023-09-29 DIAGNOSIS — D2262 Melanocytic nevi of left upper limb, including shoulder: Secondary | ICD-10-CM | POA: Diagnosis not present

## 2023-09-29 DIAGNOSIS — D2271 Melanocytic nevi of right lower limb, including hip: Secondary | ICD-10-CM | POA: Diagnosis not present

## 2023-09-29 DIAGNOSIS — L821 Other seborrheic keratosis: Secondary | ICD-10-CM | POA: Diagnosis not present

## 2023-09-29 DIAGNOSIS — L570 Actinic keratosis: Secondary | ICD-10-CM | POA: Diagnosis not present

## 2023-11-27 DIAGNOSIS — J069 Acute upper respiratory infection, unspecified: Secondary | ICD-10-CM | POA: Diagnosis not present

## 2023-11-27 DIAGNOSIS — R0981 Nasal congestion: Secondary | ICD-10-CM | POA: Diagnosis not present

## 2023-11-27 DIAGNOSIS — R051 Acute cough: Secondary | ICD-10-CM | POA: Diagnosis not present

## 2023-12-08 DIAGNOSIS — H40153 Residual stage of open-angle glaucoma, bilateral: Secondary | ICD-10-CM | POA: Diagnosis not present

## 2024-01-12 DIAGNOSIS — N183 Chronic kidney disease, stage 3 unspecified: Secondary | ICD-10-CM | POA: Diagnosis not present

## 2024-01-12 DIAGNOSIS — R7303 Prediabetes: Secondary | ICD-10-CM | POA: Diagnosis not present

## 2024-01-12 DIAGNOSIS — I129 Hypertensive chronic kidney disease with stage 1 through stage 4 chronic kidney disease, or unspecified chronic kidney disease: Secondary | ICD-10-CM | POA: Diagnosis not present

## 2024-01-12 DIAGNOSIS — E039 Hypothyroidism, unspecified: Secondary | ICD-10-CM | POA: Diagnosis not present

## 2024-01-29 DIAGNOSIS — H900 Conductive hearing loss, bilateral: Secondary | ICD-10-CM | POA: Diagnosis not present

## 2024-01-29 DIAGNOSIS — H6121 Impacted cerumen, right ear: Secondary | ICD-10-CM | POA: Diagnosis not present
